# Patient Record
Sex: Male | Born: 1950 | Race: Black or African American | Hispanic: No | Marital: Single | Smoking: Never smoker
Health system: Southern US, Community
[De-identification: ages and names within clinical notes are randomized; demographics above are authoritative.]

## PROBLEM LIST (undated history)

## (undated) DIAGNOSIS — E119 Type 2 diabetes mellitus without complications: Secondary | ICD-10-CM

## (undated) DIAGNOSIS — N289 Disorder of kidney and ureter, unspecified: Secondary | ICD-10-CM

## (undated) DIAGNOSIS — I10 Essential (primary) hypertension: Secondary | ICD-10-CM

## (undated) DIAGNOSIS — K219 Gastro-esophageal reflux disease without esophagitis: Secondary | ICD-10-CM

## (undated) DIAGNOSIS — E785 Hyperlipidemia, unspecified: Secondary | ICD-10-CM

---

## 1997-09-18 ENCOUNTER — Emergency Department (HOSPITAL_COMMUNITY): Admission: EM | Admit: 1997-09-18 | Discharge: 1997-09-18 | Payer: Self-pay | Admitting: Emergency Medicine

## 2004-10-13 ENCOUNTER — Emergency Department (HOSPITAL_COMMUNITY): Admission: EM | Admit: 2004-10-13 | Discharge: 2004-10-13 | Payer: Self-pay | Admitting: *Deleted

## 2013-07-08 ENCOUNTER — Emergency Department (HOSPITAL_COMMUNITY)
Admission: EM | Admit: 2013-07-08 | Discharge: 2013-07-08 | Disposition: A | Discharge status: Home / Self Care | Payer: Self-pay | Attending: Emergency Medicine | Admitting: Emergency Medicine

## 2013-07-08 ENCOUNTER — Encounter (HOSPITAL_COMMUNITY): Payer: Self-pay | Admitting: Emergency Medicine

## 2013-07-08 DIAGNOSIS — K4090 Unilateral inguinal hernia, without obstruction or gangrene, not specified as recurrent: Secondary | ICD-10-CM | POA: Insufficient documentation

## 2013-07-08 DIAGNOSIS — N5089 Other specified disorders of the male genital organs: Secondary | ICD-10-CM | POA: Insufficient documentation

## 2013-07-08 DIAGNOSIS — K4091 Unilateral inguinal hernia, without obstruction or gangrene, recurrent: Secondary | ICD-10-CM

## 2013-07-08 DIAGNOSIS — I10 Essential (primary) hypertension: Secondary | ICD-10-CM | POA: Insufficient documentation

## 2013-07-08 DIAGNOSIS — N509 Disorder of male genital organs, unspecified: Secondary | ICD-10-CM | POA: Insufficient documentation

## 2013-07-08 DIAGNOSIS — E119 Type 2 diabetes mellitus without complications: Secondary | ICD-10-CM | POA: Insufficient documentation

## 2013-07-08 DIAGNOSIS — N289 Disorder of kidney and ureter, unspecified: Secondary | ICD-10-CM | POA: Insufficient documentation

## 2013-07-08 HISTORY — DX: Essential (primary) hypertension: I10

## 2013-07-08 LAB — BASIC METABOLIC PANEL
BUN: 44 mg/dL — AB (ref 6–23)
CALCIUM: 9.4 mg/dL (ref 8.4–10.5)
CHLORIDE: 99 meq/L (ref 96–112)
CO2: 22 mEq/L (ref 19–32)
Creatinine, Ser: 2.11 mg/dL — ABNORMAL HIGH (ref 0.50–1.35)
GFR, EST AFRICAN AMERICAN: 37 mL/min — AB (ref >90)
GFR, EST NON AFRICAN AMERICAN: 32 mL/min — AB (ref >90)
GLUCOSE: 329 mg/dL — AB (ref 70–99)
POTASSIUM: 4.5 meq/L (ref 3.7–5.3)
SODIUM: 137 meq/L (ref 137–147)

## 2013-07-08 LAB — CBC WITH DIFFERENTIAL/PLATELET
Basophils Absolute: 0 10*3/uL (ref 0.0–0.1)
Basophils Relative: 1 % (ref 0–1)
EOS PCT: 1 % (ref 0–5)
Eosinophils Absolute: 0 10*3/uL (ref 0.0–0.7)
HCT: 40.4 % (ref 39.0–52.0)
Hemoglobin: 14.3 g/dL (ref 13.0–17.0)
LYMPHS ABS: 1.1 10*3/uL (ref 0.7–4.0)
Lymphocytes Relative: 13 % (ref 12–46)
MCH: 27.4 pg (ref 26.0–34.0)
MCHC: 35.4 g/dL (ref 30.0–36.0)
MCV: 77.5 fL — ABNORMAL LOW (ref 78.0–100.0)
Monocytes Absolute: 0.4 10*3/uL (ref 0.1–1.0)
Monocytes Relative: 5 % (ref 3–12)
Neutro Abs: 6.7 10*3/uL (ref 1.7–7.7)
Neutrophils Relative %: 80 % — ABNORMAL HIGH (ref 43–77)
PLATELETS: 200 10*3/uL (ref 150–400)
RBC: 5.21 MIL/uL (ref 4.22–5.81)
RDW: 13.1 % (ref 11.5–15.5)
WBC: 8.2 10*3/uL (ref 4.0–10.5)

## 2013-07-08 LAB — I-STAT CG4 LACTIC ACID, ED: Lactic Acid, Venous: 1.04 mmol/L (ref 0.5–2.2)

## 2013-07-08 MED ORDER — HYDROMORPHONE HCL PF 1 MG/ML IJ SOLN
1.0000 mg | Freq: Once | INTRAMUSCULAR | Status: AC
  Administered 2013-07-08: 1 mg via INTRAVENOUS
  Filled 2013-07-08: qty 1

## 2013-07-08 MED ORDER — HYDROCODONE-ACETAMINOPHEN 5-325 MG PO TABS: ORAL_TABLET | ORAL | Status: DC

## 2013-07-08 MED ORDER — ONDANSETRON HCL 4 MG/2ML IJ SOLN
4.0000 mg | Freq: Once | INTRAMUSCULAR | Status: AC
  Administered 2013-07-08: 4 mg via INTRAVENOUS
  Filled 2013-07-08: qty 2

## 2013-07-08 NOTE — ED Notes (Signed)
Geiple at bedside

## 2013-07-08 NOTE — ED Provider Notes (Signed)
CSN: [id number]     Arrival date & time 07/08/13  [id number] History   First MD Initiated Contact with Patient 07/08/13 [phone]     Chief Complaint  Patient presents with  . Abdominal Pain     (Consider location/radiation/quality/duration/timing/severity/associated sxs/prior Treatment) HPI Comments: Patient with past history of diabetes and hypertension -- presents with acute onset of right groin pain at approximately 5 AM. This has been associated with swelling. Patient has had similar symptoms with this for "a long time". It seems as though he has a bulging in the area which comes and goes. Today the bulging did not get better and the pain is severe. He denies fever, nausea, vomiting, diarrhea. Patient has lived in the [place] for approximately 10 years. He is from [place]. No past history of surgeries. No past allergies. The onset of this condition was acute. The course is constant. Aggravating factors: palpation and movement. Alleviating factors: none.    Patient is a 63 y.o. male presenting with abdominal pain. The history is provided by the patient. The history is limited by a language barrier. A language interpreter was used (friend at bedside).  Abdominal Pain Associated symptoms: no chest pain, no cough, no diarrhea, no dysuria, no fever, no nausea, no sore throat and no vomiting     No past medical history on file. No past surgical history on file. No family history on file. History  Substance Use Topics  . Smoking status: Not on file  . Smokeless tobacco: Not on file  . Alcohol Use: Not on file    Review of Systems  Constitutional: Negative for fever.  HENT: Negative for rhinorrhea and sore throat.   Eyes: Negative for redness.  Respiratory: Negative for cough.   Cardiovascular: Negative for chest pain.  Gastrointestinal: Positive for abdominal pain. Negative for nausea, vomiting and diarrhea.  Genitourinary: Positive for scrotal swelling and testicular pain. Negative for  dysuria.  Musculoskeletal: Negative for myalgias.  Skin: Negative for rash.  Neurological: Negative for headaches.      Allergies  Review of patient's allergies indicates not on file.  Home Medications   Prior to Admission medications   Not on File   BP 203/100  Pulse 71  Temp(Src) 98.4 F (36.9 C) (Oral)  Resp 22  SpO2 100%  Physical Exam  Nursing note and vitals reviewed. Constitutional: He appears well-developed and well-nourished.  HENT:  Head: Normocephalic and atraumatic.  Eyes: Conjunctivae are normal. Right eye exhibits no discharge. Left eye exhibits no discharge.  Neck: Normal range of motion. Neck supple.  Cardiovascular: Normal rate, regular rhythm and normal heart sounds.   Pulmonary/Chest: Effort normal and breath sounds normal.  Abdominal: Soft. There is no tenderness. A hernia is present. Hernia confirmed positive in the right inguinal area (tennis ball sized). Hernia confirmed negative in the left inguinal area.  Genitourinary: Right testis shows mass and tenderness.  Neurological: He is alert.  Skin: Skin is warm and dry.  Psychiatric: He has a normal mood and affect.    ED Course  Procedures (including critical care time) Labs Review Labs Reviewed  CBC WITH DIFFERENTIAL - Abnormal; Notable for the following:    MCV 77.5 (*)    Neutrophils Relative % 80 (*)    All other components within normal limits  BASIC METABOLIC PANEL - Abnormal; Notable for the following:    Glucose, Bld 329 (*)    BUN 44 (*)    Creatinine, Ser 2.11 (*)    GFR calc  non Af Amer 32 (*)    GFR calc Af Amer 37 (*)    All other components within normal limits  URINALYSIS, ROUTINE W REFLEX MICROSCOPIC  I-STAT CG4 LACTIC ACID, ED    Imaging Review No results found.   EKG Interpretation None      8:58 AM Patient seen and examined. Work-up initiated. Medications ordered. Deen at bedside with Dr. Elesa MassedWard. Surgery paged. Attempts to reduce hernia in trendelenburg were  unsuccessful.   Vital signs reviewed and are as follows: Filed Vitals:   07/08/13 0840  BP: 203/100  Pulse: 71  Temp: 98.4 F (36.9 C)  Resp: 22   9:27 AM Spoke with Dr. Lindie SpruceWyatt who has seen patient. He was able to reduce hernia. Patient cleared for d/c to home and follow-up with him in office.   10:58 AM Labs reviewed with patient. Informed of elevated creatinine and need for f/u. Referrals given.  Re-exam: Pt comfortable, hernia remains reduced.  HTN improved after reduction of hernia. BP 144/87  Pulse 75  Temp(Src) 98.4 F (36.9 C) (Oral)  Resp 13  SpO2 94%   Patient counseled on use of narcotic pain medications. Counseled not to combine these medications with others containing tylenol. Urged not to drink alcohol, drive, or perform any other activities that requires focus while taking these medications. The patient verbalizes understanding and agrees with the plan.    MDM   Final diagnoses:  Inguinal hernia recurrent unilateral  Renal insufficiency  Hypertension   Hernia: reduced with the help of surgery. Outpatient f/u indicated. Pain medicine given.  Renal insufficiency: Likely chronic 2/2 HTN/DM. Outpatient f/u given.   HTN: Improved without treatment. Do not have indication to start patient on HTN meds today, outpatient f/u indicated.      Renne CriglerJoshua Kenlee Maler, PA-C 07/08/13 1101

## 2013-07-08 NOTE — ED Notes (Addendum)
Dr Lindie SpruceWyatt at bedside, reduced hernia

## 2013-07-08 NOTE — ED Notes (Signed)
Patient states that he takes medications for blood pressure and diabetes but is unsure of the name.  Patient states he cannot read or write, he only speaks Housa

## 2013-07-08 NOTE — ED Notes (Signed)
Per EMS: Patient c/o abdominal pain, from [place], speaks howza (Sp?) patient called friend this morning c/o abdominal pain. friend here with patient, patient from [place] not out of country in last 10 years.  Patient tearful, c/o swelling in groin, CBG 260, BP 194/98, spo2 100.  NSR on monitor.  States he has been fgeeling this way in the mornings for some time, has not sought care until this point

## 2013-07-08 NOTE — Discharge Instructions (Signed)
Please read and follow all provided instructions.  Your diagnoses today include:  1. Inguinal hernia recurrent unilateral   2. Renal insufficiency   3. Hypertension     Tests performed today include:  Blood counts and electrolytes  Blood tests to check kidney function - shows kidneys are weak, high blood sugar  Vital signs. See below for your results today.   Medications prescribed:   Vicodin (hydrocodone/acetaminophen) - narcotic pain medication  DO NOT drive or perform any activities that require you to be awake and alert because this medicine can make you drowsy. BE VERY CAREFUL not to take multiple medicines containing Tylenol (also called acetaminophen). Doing so can lead to an overdose which can damage your liver and cause liver failure and possibly death.  Take any prescribed medications only as directed.  Home care instructions:   Follow any educational materials contained in this packet.  Follow-up instructions: Please follow-up with your primary care provider in the next 7 days for further evaluation of your symptoms. If you do not have a primary care doctor -- see below for referral information.   Return instructions:  SEEK IMMEDIATE MEDICAL ATTENTION IF:  The pain does not go away or becomes severe   A temperature above 101F develops   Repeated vomiting occurs (multiple episodes)   The pain becomes localized to portions of the abdomen. The right side could possibly be appendicitis. In an adult, the left lower portion of the abdomen could be colitis or diverticulitis.   Blood is being passed in stools or vomit (bright red or black tarry stools)   You develop chest pain, difficulty breathing, dizziness or fainting, or become confused, poorly responsive, or inconsolable (young children)  If you have any other emergent concerns regarding your health  Additional Information: Abdominal (belly) pain can be caused by many things. Your caregiver performed an  examination and possibly ordered blood/urine tests and imaging (CT scan, x-rays, ultrasound). Many cases can be observed and treated at home after initial evaluation in the emergency department. Even though you are being discharged home, abdominal pain can be unpredictable. Therefore, you need a repeated exam if your pain does not resolve, returns, or worsens. Most patients with abdominal pain don't have to be admitted to the hospital or have surgery, but serious problems like appendicitis and gallbladder attacks can start out as nonspecific pain. Many abdominal conditions cannot be diagnosed in one visit, so follow-up evaluations are very important.  Your vital signs today were: BP 144/87   Pulse 75   Temp(Src) 98.4 F (36.9 C) (Oral)   Resp 13   SpO2 94% If your blood pressure (bp) was elevated above 135/85 this visit, please have this repeated by your doctor within one month. --------------

## 2013-07-08 NOTE — ED Notes (Signed)
Lab results reported to Dr.Ward. 

## 2013-07-08 NOTE — Consult Note (Signed)
  History of RIH, reducible.  Became incarcerated this AM at about 5.  I was able to easily reduce this hernia within a few minutes.  It will continue to come out, but not incarcerated anymore.  Needs to make appointment [facility].  Marta LamasJames O. Gae BonWyatt, III, MD, FACS [phone]--pager [phone]--office [facility]

## 2013-07-08 NOTE — ED Provider Notes (Signed)
Medical screening examination/treatment/procedure(s) were conducted as a shared visit with non-physician practitioner(s) and myself.  I personally evaluated the patient during the encounter.   EKG Interpretation None      Pt is a 63 y.o. M with history of hypertension, diabetes who presents emergency department with a incarcerated right inguinal hernia that started at 5 AM. Unable to reduce hernia despite multiple times while patient is in Trendelenburg. He is hypertensive and appears uncomfortable but is nontoxic. We'll consult surgery, obtain labs.   9:42 AM  Dr. Lindie SpruceWyatt has seen and was able to successfully reduce pt's hernia.  Will dc once labs have returned with outpt surgery follow up.  Javier MawKristen N Jaycee Pelzer, DO 07/08/13 [phone]

## 2013-07-08 NOTE — ED Notes (Signed)
Patient has a large soft mass in the right inguinal region

## 2013-07-26 ENCOUNTER — Ambulatory Visit (INDEPENDENT_AMBULATORY_CARE_PROVIDER_SITE_OTHER): Payer: Self-pay | Admitting: General Surgery

## 2013-07-26 ENCOUNTER — Encounter (INDEPENDENT_AMBULATORY_CARE_PROVIDER_SITE_OTHER): Payer: Self-pay | Admitting: General Surgery

## 2013-07-26 VITALS — BP 162/100 | HR 80 | Temp 97.2°F | Resp 14 | Ht 68.0 in | Wt 155.0 lb

## 2013-07-26 DIAGNOSIS — K4030 Unilateral inguinal hernia, with obstruction, without gangrene, not specified as recurrent: Secondary | ICD-10-CM

## 2013-07-26 NOTE — Patient Instructions (Signed)
Hernia A hernia occurs when an internal organ pushes out through a weak spot in the abdominal wall. Hernias most commonly occur in the groin and around the navel. Hernias often can be pushed back into place (reduced). Most hernias tend to get worse over time. Some abdominal hernias can get stuck in the opening (irreducible or incarcerated hernia) and cannot be reduced. An irreducible abdominal hernia which is tightly squeezed into the opening is at risk for impaired blood supply (strangulated hernia). A strangulated hernia is a medical emergency. Because of the risk for an irreducible or strangulated hernia, surgery may be recommended to repair a hernia. CAUSES   Heavy lifting.  Prolonged coughing.  Straining to have a bowel movement.  A cut (incision) made during an abdominal surgery. HOME CARE INSTRUCTIONS   Bed rest is not required. You may continue your normal activities.  Avoid lifting more than 10 pounds (4.5 kg) or straining.  Cough gently. If you are a smoker it is best to stop. Even the best hernia repair can break down with the continual strain of coughing. Even if you do not have your hernia repaired, a cough will continue to aggravate the problem.  Do not wear anything tight over your hernia. Do not try to keep it in with an outside bandage or truss. These can damage abdominal contents if they are trapped within the hernia sac.  Eat a normal diet.  Avoid constipation. Straining over long periods of time will increase hernia size and encourage breakdown of repairs. If you cannot do this with diet alone, stool softeners may be used. SEEK IMMEDIATE MEDICAL CARE IF:   You have a fever.  You develop increasing abdominal pain.  You feel nauseous or vomit.  Your hernia is stuck outside the abdomen, looks discolored, feels hard, or is tender.  You have any changes in your bowel habits or in the hernia that are unusual for you.  You have increased pain or swelling around the  hernia.  You cannot push the hernia back in place by applying gentle pressure while lying down. MAKE SURE YOU:   Understand these instructions.  Will watch your condition.  Will get help right away if you are not doing well or get worse. Document Released: 02/22/2005 Document Revised: 05/17/2011 Document Reviewed: 10/12/2007 ExitCare Patient Information 2014 [employer].  

## 2013-07-26 NOTE — Progress Notes (Signed)
Patient ID: Javier Hines, male   DOB: 1951-01-16, 63 y.o.   MRN: [id number]  Chief Complaint  Patient presents with  . New Evaluation    eval RIH    HPI Javier Hines is a 63 y.o. male.   HPI 63 year old African male initially seen in the emergency room about 2-1/2 weeks ago for a large right scrotal inguinal hernia comes in today for evaluation. His hernia was reduced in the emergency room by Dr. Lindie Spruce. History is somewhat limited due to language barrier. He states with the aid of his friend who functions as his interpreter that the hernia has been present for quite some time. It does cause him pain and discomfort.  He does have a primary care physician but he cannot remember the name of them. He does take medicine for blood pressure and diabetes. His creatinine was elevated in the emergency room. It is unclear if this is a new issue or chronic issue.  Past Medical History  Diagnosis Date  . Diabetes   . Hypertension     History reviewed. No pertinent past surgical history.  History reviewed. No pertinent family history.  Social History History  Substance Use Topics  . Smoking status: Never Smoker   . Smokeless tobacco: Never Used  . Alcohol Use: No    No Known Allergies  Current Outpatient Prescriptions  Medication Sig Dispense Refill  . atenolol (TENORMIN) 100 MG tablet Take 100 mg by mouth daily.      Marland Kitchen glipiZIDE (GLUCOTROL) 10 MG tablet Take 10 mg by mouth daily before breakfast.      . metFORMIN (GLUCOPHAGE) 500 MG tablet Take by mouth 2 (two) times daily with a meal.      . omeprazole (PRILOSEC) 20 MG capsule Take 20 mg by mouth daily.       No current facility-administered medications for this visit.    Review of Systems Review of Systems  Constitutional: Negative for fever, chills, appetite change and unexpected weight change.  HENT: Negative for congestion and trouble swallowing.   Eyes: Negative for visual disturbance.  Respiratory: Positive for shortness of  breath. Negative for chest tightness.   Cardiovascular: Negative for chest pain and leg swelling.       No PND, no orthopnea, no DOE  Gastrointestinal: Positive for abdominal pain. Negative for nausea, constipation and blood in stool.       See HPI  Genitourinary: Negative for dysuria and hematuria.  Musculoskeletal: Negative.   Skin: Negative for rash.  Neurological: Negative for seizures and speech difficulty.  Hematological: Does not bruise/bleed easily.  Psychiatric/Behavioral: Negative for behavioral problems and confusion.    Blood pressure 162/100, pulse 80, temperature 97.2 F (36.2 C), temperature source Temporal, resp. rate 14, height 5\' 8"  (1.727 m), weight 155 lb (70.308 kg).  Physical Exam Physical Exam  Constitutional: He is oriented to person, place, and time. He appears well-developed and well-nourished. No distress.  HENT:  Head: Normocephalic and atraumatic.  Right Ear: External ear normal.  Left Ear: External ear normal.  Eyes: Conjunctivae are normal. No scleral icterus.  Neck: Normal range of motion. Neck supple. No tracheal deviation present. No thyromegaly present.  Cardiovascular: Normal rate, normal heart sounds and intact distal pulses.   Pulmonary/Chest: Effort normal and breath sounds normal. No respiratory distress. He has no wheezes.  Abdominal: Soft. He exhibits no distension. There is no tenderness. There is no rebound. A hernia is present. Hernia confirmed positive in the right inguinal area. Hernia confirmed negative  in the left inguinal area.  Genitourinary: Testes normal.     Decent size R inguinal/scrotal incarcerated hernia, reducible. Both testicles down.  Musculoskeletal: Normal range of motion. He exhibits no edema and no tenderness.  Lymphadenopathy:    He has no cervical adenopathy.  Neurological: He is alert and oriented to person, place, and time. He exhibits normal muscle tone.  Skin: Skin is warm and dry. No rash noted. He is not  diaphoretic. No erythema. No pallor.  Psychiatric: He has a normal mood and affect. His behavior is normal. Judgment and thought content normal.    Data Reviewed ED note and Dr Dixon BoosWyatt's note Cr >2  Assessment    Large chronic incarcerated inguinal hernia     Plan    We discussed the etiology of inguinal hernias. We discussed the signs & symptoms of incarceration & strangulation.  We discussed non-operative and operative management.  The patient has elected to proceed with open repair of Right incarcerated Inguinal hernia with mesh    I described the procedure in detail.  The patient was given educational material. We discussed the risks and benefits including but not limited to bleeding, infection, chronic inguinal pain, nerve entrapment, hernia recurrence, mesh complications, hematoma formation, urinary retention, injury to the testicles or the ovaries, numbness in the groin, blood clots, injury to the surrounding structures, and anesthesia risk. We also discussed the typical post operative recovery course, including no heavy lifting for 4-6 weeks. I explained that the likelihood of improvement of their symptoms is good   The conversation was done with aid of language line - language is Hausa  Mary Sellaric M. Andrey CampanileWilson, MD, Javier Hines [facility] B[facility]ally [facility]        Atilano Inaric M Derisha Funderburke 07/26/2013, 10:38 AM

## 2013-07-27 ENCOUNTER — Encounter (HOSPITAL_COMMUNITY): Payer: Self-pay | Admitting: Pharmacy Technician

## 2013-07-31 NOTE — Patient Instructions (Signed)
Javier Hines  07/31/2013   Your procedure is scheduled on:  08/03/13   0930am-1130am  Report to [facility].  Follow the Signs to Short Stay Center at  0730      am  Call this number if you have problems the morning of surgery: [phone]   Remember:   Do not eat food or drink liquids after midnight.   Take these medicines the morning of surgery with A SIP OF WATER:    Do not wear jewelry,   Do not wear lotions, powders, or perfumes.    Men may shave face and neck.  Do not bring valuables to the hospital.  Contacts, dentures or bridgework may not be worn into surgery.  Leave suitcase in the car. After surgery it may be brought to your room.  For patients admitted to the hospital, checkout time is 11:00 AM the day of  discharge.          Please read over the following fact sheets that you were [facility] - Preparing for Surgery Before surgery, you can play an important role.  Because skin is not sterile, your skin needs to be as free of germs as possible.  You can reduce the number of germs on your skin by washing with CHG (chlorahexidine gluconate) soap before surgery.  CHG is an antiseptic cleaner which kills germs and bonds with the skin to continue killing germs even after washing. Please DO NOT use if you have an allergy to CHG or antibacterial soaps.  If your skin becomes reddened/irritated stop using the CHG and inform your nurse when you arrive at Short Stay. Do not shave (including legs and underarms) for at least 48 hours prior to the first CHG shower.  You may shave your face/neck. Please follow these instructions carefully:  1.  Shower with CHG Soap the night before surgery and the  morning of Surgery.  2.  If you choose to wash your hair, wash your hair first as usual with your  normal  shampoo.  3.  After you shampoo, rinse your hair and body thoroughly to remove the  shampoo.                           4.  Use CHG as you would any other liquid soap.  You can  apply chg directly  to the skin and wash                       Gently with a scrungie or clean washcloth.  5.  Apply the CHG Soap to your body ONLY FROM THE NECK DOWN.   Do not use on face/ open                           Wound or open sores. Avoid contact with eyes, ears mouth and genitals (private parts).                       Wash face,  Genitals (private parts) with your normal soap.             6.  Wash thoroughly, paying special attention to the area where your surgery  will be performed.  7.  Thoroughly rinse your body with warm water from the neck down.  8.  DO NOT shower/wash with your normal soap after using and rinsing off  the CHG Soap.                9.  Pat yourself dry with a clean towel.            10.  Wear clean pajamas.            11.  Place clean sheets on your bed the night of your first shower and do not  sleep with pets. Day of Surgery : Do not apply any lotions/deodorants the morning of surgery.  Please wear clean clothes to the hospital/surgery center.  FAILURE TO FOLLOW THESE INSTRUCTIONS MAY RESULT IN THE CANCELLATION OF YOUR SURGERY PATIENT SIGNATURE_________________________________  NURSE SIGNATURE__________________________________  ________________________________________________________________________ , coughing and deep breathing exercises, leg exercises

## 2013-08-02 ENCOUNTER — Encounter (HOSPITAL_COMMUNITY): Payer: Self-pay

## 2013-08-02 ENCOUNTER — Ambulatory Visit (HOSPITAL_COMMUNITY)
Admission: RE | Admit: 2013-08-02 | Discharge: 2013-08-02 | Disposition: A | Discharge status: Home / Self Care | Payer: Self-pay | Source: Ambulatory Visit | Attending: General Surgery | Admitting: General Surgery

## 2013-08-02 ENCOUNTER — Encounter (HOSPITAL_COMMUNITY)
Admission: RE | Admit: 2013-08-02 | Discharge: 2013-08-02 | Disposition: A | Discharge status: Home / Self Care | Payer: Self-pay | Source: Ambulatory Visit | Attending: General Surgery | Admitting: General Surgery

## 2013-08-02 DIAGNOSIS — I10 Essential (primary) hypertension: Secondary | ICD-10-CM | POA: Insufficient documentation

## 2013-08-02 HISTORY — DX: Disorder of kidney and ureter, unspecified: N28.9

## 2013-08-02 LAB — CBC
HEMATOCRIT: 41.4 % (ref 39.0–52.0)
HEMOGLOBIN: 14.4 g/dL (ref 13.0–17.0)
MCH: 26.9 pg (ref 26.0–34.0)
MCHC: 34.8 g/dL (ref 30.0–36.0)
MCV: 77.4 fL — AB (ref 78.0–100.0)
Platelets: 192 10*3/uL (ref 150–400)
RBC: 5.35 MIL/uL (ref 4.22–5.81)
RDW: 12.7 % (ref 11.5–15.5)
WBC: 6.4 10*3/uL (ref 4.0–10.5)

## 2013-08-02 LAB — COMPREHENSIVE METABOLIC PANEL
ALT: 15 U/L (ref 0–53)
AST: 13 U/L (ref 0–37)
Albumin: 3.8 g/dL (ref 3.5–5.2)
Alkaline Phosphatase: 104 U/L (ref 39–117)
BUN: 35 mg/dL — AB (ref 6–23)
CO2: 28 meq/L (ref 19–32)
Calcium: 9.5 mg/dL (ref 8.4–10.5)
Chloride: 94 mEq/L — ABNORMAL LOW (ref 96–112)
Creatinine, Ser: 2.27 mg/dL — ABNORMAL HIGH (ref 0.50–1.35)
GFR, EST AFRICAN AMERICAN: 34 mL/min — AB (ref >90)
GFR, EST NON AFRICAN AMERICAN: 29 mL/min — AB (ref >90)
Glucose, Bld: 336 mg/dL — ABNORMAL HIGH (ref 70–99)
Potassium: 4.3 mEq/L (ref 3.7–5.3)
SODIUM: 136 meq/L — AB (ref 137–147)
Total Bilirubin: 0.5 mg/dL (ref 0.3–1.2)
Total Protein: 7.1 g/dL (ref 6.0–8.3)

## 2013-08-02 NOTE — Progress Notes (Signed)
CMP results faxed via EPIC to Dr Gaynelle Adu.

## 2013-08-02 NOTE — Progress Notes (Signed)
Initial blood pressure at time of preop appointment was 198/110 on right and 178/109 on left. At 0915 am.  Recheck of blood pressure was 180/102on right and left was 156/101.  At end of preop appointment blood pressure was 178/101 on left at 1015am.  Interpreter present for preop appointment .  Patient did not take medications this am prior to preop appointment.  Instructed patient at end of preop appointment to go home and eat and drink and take blood pressure medication.  Patient voiced understanding.

## 2013-08-02 NOTE — Progress Notes (Signed)
Dr Mikeal Hawthorne with [facility] is PCP for patient.  Phone [phone] [phone].

## 2013-08-02 NOTE — Progress Notes (Signed)
Spoke with Annite, nurse of Dr Gaynelle Adu regarding elevated blood pressure readings at today's preop appointment.  Informed her that progress note had been sent via EPIC to Dr Andrey Campanile.  She stated she would inform Dr Andrey Campanile.

## 2013-08-03 ENCOUNTER — Ambulatory Visit (HOSPITAL_COMMUNITY): Payer: Self-pay | Admitting: *Deleted

## 2013-08-03 ENCOUNTER — Encounter (HOSPITAL_COMMUNITY): Payer: Self-pay | Admitting: *Deleted

## 2013-08-03 ENCOUNTER — Encounter (HOSPITAL_COMMUNITY): Payer: MEDICAID | Admitting: *Deleted

## 2013-08-03 ENCOUNTER — Ambulatory Visit (HOSPITAL_COMMUNITY)
Admission: RE | Admit: 2013-08-03 | Discharge: 2013-08-03 | Disposition: A | Discharge status: Home / Self Care | Payer: Self-pay | Source: Ambulatory Visit | Attending: General Surgery | Admitting: General Surgery

## 2013-08-03 ENCOUNTER — Encounter (HOSPITAL_COMMUNITY)
Admission: RE | Disposition: A | Discharge status: Home / Self Care | Payer: Self-pay | Source: Ambulatory Visit | Attending: General Surgery

## 2013-08-03 DIAGNOSIS — I10 Essential (primary) hypertension: Secondary | ICD-10-CM | POA: Insufficient documentation

## 2013-08-03 DIAGNOSIS — Z79899 Other long term (current) drug therapy: Secondary | ICD-10-CM | POA: Insufficient documentation

## 2013-08-03 DIAGNOSIS — E119 Type 2 diabetes mellitus without complications: Secondary | ICD-10-CM | POA: Insufficient documentation

## 2013-08-03 DIAGNOSIS — K219 Gastro-esophageal reflux disease without esophagitis: Secondary | ICD-10-CM | POA: Insufficient documentation

## 2013-08-03 DIAGNOSIS — K4030 Unilateral inguinal hernia, with obstruction, without gangrene, not specified as recurrent: Secondary | ICD-10-CM

## 2013-08-03 HISTORY — PX: INSERTION OF MESH: SHX5868

## 2013-08-03 HISTORY — PX: INGUINAL HERNIA REPAIR: SHX194

## 2013-08-03 LAB — GLUCOSE, CAPILLARY
GLUCOSE-CAPILLARY: 120 mg/dL — AB (ref 70–99)
Glucose-Capillary: 330 mg/dL — ABNORMAL HIGH (ref 70–99)
Glucose-Capillary: 214 mg/dL — ABNORMAL HIGH (ref 70–99)
Glucose-Capillary: 72 mg/dL (ref 70–99)

## 2013-08-03 SURGERY — REPAIR, HERNIA, INGUINAL, ADULT
Anesthesia: General | Site: Groin | Laterality: Right

## 2013-08-03 MED ORDER — ROCURONIUM BROMIDE 100 MG/10ML IV SOLN
INTRAVENOUS | Status: AC
  Filled 2013-08-03: qty 1

## 2013-08-03 MED ORDER — FENTANYL CITRATE 0.05 MG/ML IJ SOLN
INTRAMUSCULAR | Status: AC
  Filled 2013-08-03: qty 5

## 2013-08-03 MED ORDER — 0.9 % SODIUM CHLORIDE (POUR BTL) OPTIME
TOPICAL | Status: DC | PRN
  Administered 2013-08-03: 1000 mL

## 2013-08-03 MED ORDER — NEOSTIGMINE METHYLSULFATE 10 MG/10ML IV SOLN
INTRAVENOUS | Status: DC | PRN
  Administered 2013-08-03: 4 mg via INTRAVENOUS

## 2013-08-03 MED ORDER — HYDROMORPHONE HCL PF 1 MG/ML IJ SOLN
INTRAMUSCULAR | Status: AC
  Filled 2013-08-03: qty 1

## 2013-08-03 MED ORDER — DEXTROSE 50 % IV SOLN
INTRAVENOUS | Status: DC | PRN
  Administered 2013-08-03: 25 mL via INTRAVENOUS

## 2013-08-03 MED ORDER — PROPOFOL 10 MG/ML IV BOLUS
INTRAVENOUS | Status: AC
  Filled 2013-08-03: qty 20

## 2013-08-03 MED ORDER — GLYCOPYRROLATE 0.2 MG/ML IJ SOLN
INTRAMUSCULAR | Status: DC | PRN
  Administered 2013-08-03: 0.6 mg via INTRAVENOUS

## 2013-08-03 MED ORDER — ESMOLOL HCL 10 MG/ML IV SOLN
INTRAVENOUS | Status: AC
  Filled 2013-08-03: qty 10

## 2013-08-03 MED ORDER — PROMETHAZINE HCL 25 MG/ML IJ SOLN: 6.2500 mg | INTRAMUSCULAR | Status: DC | PRN

## 2013-08-03 MED ORDER — ONDANSETRON HCL 4 MG/2ML IJ SOLN
INTRAMUSCULAR | Status: DC | PRN
  Administered 2013-08-03: 4 mg via INTRAVENOUS

## 2013-08-03 MED ORDER — OXYCODONE HCL 5 MG PO TABS: 5.0000 mg | ORAL_TABLET | Freq: Four times a day (QID) | ORAL | Status: AC | PRN

## 2013-08-03 MED ORDER — LIDOCAINE HCL (CARDIAC) 20 MG/ML IV SOLN
INTRAVENOUS | Status: DC | PRN
  Administered 2013-08-03: 50 mg via INTRAVENOUS

## 2013-08-03 MED ORDER — FENTANYL CITRATE 0.05 MG/ML IJ SOLN
INTRAMUSCULAR | Status: DC | PRN
  Administered 2013-08-03: 50 ug via INTRAVENOUS
  Administered 2013-08-03: 100 ug via INTRAVENOUS
  Administered 2013-08-03 (×2): 50 ug via INTRAVENOUS

## 2013-08-03 MED ORDER — BUPIVACAINE-EPINEPHRINE 0.25% -1:200000 IJ SOLN
INTRAMUSCULAR | Status: DC | PRN
  Administered 2013-08-03: 30 mL

## 2013-08-03 MED ORDER — SODIUM CHLORIDE 0.9 % IV SOLN: 250.0000 mL | INTRAVENOUS | Status: DC | PRN

## 2013-08-03 MED ORDER — PHENYLEPHRINE 40 MCG/ML (10ML) SYRINGE FOR IV PUSH (FOR BLOOD PRESSURE SUPPORT)
PREFILLED_SYRINGE | INTRAVENOUS | Status: AC
  Filled 2013-08-03: qty 10

## 2013-08-03 MED ORDER — ESMOLOL HCL 10 MG/ML IV SOLN
INTRAVENOUS | Status: DC | PRN
  Administered 2013-08-03: 30 mg via INTRAVENOUS

## 2013-08-03 MED ORDER — CEFAZOLIN SODIUM-DEXTROSE 2-3 GM-% IV SOLR
INTRAVENOUS | Status: AC
  Filled 2013-08-03: qty 50

## 2013-08-03 MED ORDER — POTASSIUM CHLORIDE IN NACL 20-0.9 MEQ/L-% IV SOLN: INTRAVENOUS | Status: DC

## 2013-08-03 MED ORDER — BUPIVACAINE-EPINEPHRINE (PF) 0.25% -1:200000 IJ SOLN
INTRAMUSCULAR | Status: AC
  Filled 2013-08-03: qty 30

## 2013-08-03 MED ORDER — MORPHINE SULFATE 10 MG/ML IJ SOLN
1.0000 mg | INTRAMUSCULAR | Status: DC | PRN
Start: 2013-08-03 — End: 2013-08-03

## 2013-08-03 MED ORDER — LACTATED RINGERS IV SOLN
INTRAVENOUS | Status: DC | PRN
  Administered 2013-08-03: 09:00:00 via INTRAVENOUS

## 2013-08-03 MED ORDER — ACETAMINOPHEN 325 MG PO TABS: 650.0000 mg | ORAL_TABLET | ORAL | Status: DC | PRN

## 2013-08-03 MED ORDER — ACETAMINOPHEN 650 MG RE SUPP
650.0000 mg | RECTAL | Status: DC | PRN
  Filled 2013-08-03: qty 1

## 2013-08-03 MED ORDER — INSULIN ASPART 100 UNIT/ML ~~LOC~~ SOLN
0.0000 [IU] | SUBCUTANEOUS | Status: DC
  Administered 2013-08-03: 11 [IU] via SUBCUTANEOUS
  Filled 2013-08-03: qty 1

## 2013-08-03 MED ORDER — ROCURONIUM BROMIDE 100 MG/10ML IV SOLN
INTRAVENOUS | Status: DC | PRN
  Administered 2013-08-03: 50 mg via INTRAVENOUS

## 2013-08-03 MED ORDER — LACTATED RINGERS IV SOLN: INTRAVENOUS | Status: DC

## 2013-08-03 MED ORDER — CHLORHEXIDINE GLUCONATE 4 % EX LIQD: 1.0000 "application " | Freq: Once | CUTANEOUS | Status: DC

## 2013-08-03 MED ORDER — MIDAZOLAM HCL 5 MG/5ML IJ SOLN
INTRAMUSCULAR | Status: DC | PRN
  Administered 2013-08-03: 2 mg via INTRAVENOUS

## 2013-08-03 MED ORDER — PROPOFOL 10 MG/ML IV BOLUS
INTRAVENOUS | Status: DC | PRN
  Administered 2013-08-03: 180 mg via INTRAVENOUS

## 2013-08-03 MED ORDER — PHENYLEPHRINE HCL 10 MG/ML IJ SOLN
INTRAMUSCULAR | Status: DC | PRN
  Administered 2013-08-03: 60 ug via INTRAVENOUS
  Administered 2013-08-03: 80 ug via INTRAVENOUS
  Administered 2013-08-03 (×3): 40 ug via INTRAVENOUS
  Administered 2013-08-03: 60 ug via INTRAVENOUS

## 2013-08-03 MED ORDER — MIDAZOLAM HCL 2 MG/2ML IJ SOLN
INTRAMUSCULAR | Status: AC
  Filled 2013-08-03: qty 2

## 2013-08-03 MED ORDER — HYDROMORPHONE HCL PF 1 MG/ML IJ SOLN
0.2500 mg | INTRAMUSCULAR | Status: DC | PRN
  Administered 2013-08-03: 0.5 mg via INTRAVENOUS

## 2013-08-03 MED ORDER — SODIUM CHLORIDE 0.9 % IJ SOLN: 3.0000 mL | INTRAMUSCULAR | Status: DC | PRN

## 2013-08-03 MED ORDER — CEFAZOLIN SODIUM-DEXTROSE 2-3 GM-% IV SOLR
2.0000 g | INTRAVENOUS | Status: AC
  Administered 2013-08-03: 2 g via INTRAVENOUS

## 2013-08-03 MED ORDER — SODIUM CHLORIDE 0.9 % IJ SOLN: 3.0000 mL | Freq: Two times a day (BID) | INTRAMUSCULAR | Status: DC

## 2013-08-03 MED ORDER — DEXTROSE 50 % IV SOLN
INTRAVENOUS | Status: AC
  Filled 2013-08-03: qty 50

## 2013-08-03 MED ORDER — ONDANSETRON HCL 4 MG/2ML IJ SOLN
INTRAMUSCULAR | Status: AC
  Filled 2013-08-03: qty 2

## 2013-08-03 MED ORDER — OXYCODONE HCL 5 MG PO TABS: 5.0000 mg | ORAL_TABLET | ORAL | Status: DC | PRN

## 2013-08-03 MED ORDER — LIDOCAINE HCL (CARDIAC) 20 MG/ML IV SOLN
INTRAVENOUS | Status: AC
  Filled 2013-08-03: qty 5

## 2013-08-03 SURGICAL SUPPLY — 33 items
ADH SKN CLS APL DERMABOND .7 (GAUZE/BANDAGES/DRESSINGS) ×1
APL SKNCLS STERI-STRIP NONHPOA (GAUZE/BANDAGES/DRESSINGS)
BENZOIN TINCTURE PRP APPL 2/3 (GAUZE/BANDAGES/DRESSINGS) ×1 IMPLANT
BLADE SURG SZ10 CARB STEEL (BLADE) ×2 IMPLANT
CLOSURE WOUND 1/2 X4 (GAUZE/BANDAGES/DRESSINGS)
DECANTER SPIKE VIAL GLASS SM (MISCELLANEOUS) IMPLANT
DERMABOND ADVANCED (GAUZE/BANDAGES/DRESSINGS) ×2
DERMABOND ADVANCED .7 DNX12 (GAUZE/BANDAGES/DRESSINGS) IMPLANT
DRAIN PENROSE 18X1/2 LTX STRL (DRAIN) ×2 IMPLANT
DRAPE LAPAROTOMY TRNSV 102X78 (DRAPE) ×3 IMPLANT
DRAPE UTILITY XL STRL (DRAPES) ×3 IMPLANT
DRSG TEGADERM 4X4.75 (GAUZE/BANDAGES/DRESSINGS) ×1 IMPLANT
ELECT REM PT RETURN 9FT ADLT (ELECTROSURGICAL) ×3
ELECTRODE REM PT RTRN 9FT ADLT (ELECTROSURGICAL) ×1 IMPLANT
GLOVE BIO SURGEON STRL SZ7.5 (GLOVE) ×3 IMPLANT
GLOVE BIOGEL M STRL SZ7.5 (GLOVE) IMPLANT
GLOVE BIOGEL PI IND STRL 6.5 (GLOVE) IMPLANT
GLOVE BIOGEL PI INDICATOR 6.5 (GLOVE) ×2
GLOVE INDICATOR 8.0 STRL GRN (GLOVE) ×3 IMPLANT
GOWN STRL REUS W/TWL LRG LVL3 (GOWN DISPOSABLE) ×1 IMPLANT
GOWN STRL REUS W/TWL XL LVL3 (GOWN DISPOSABLE) ×6 IMPLANT
KIT BASIN OR (CUSTOM PROCEDURE TRAY) ×3 IMPLANT
MESH ULTRAPRO 3X6 7.6X15CM (Mesh General) ×2 IMPLANT
NEEDLE HYPO 22GX1.5 SAFETY (NEEDLE) ×3 IMPLANT
PACK GENERAL/GYN (CUSTOM PROCEDURE TRAY) ×3 IMPLANT
STRIP CLOSURE SKIN 1/2X4 (GAUZE/BANDAGES/DRESSINGS) ×1 IMPLANT
SUT MNCRL AB 4-0 PS2 18 (SUTURE) ×3 IMPLANT
SUT PROLENE 2 0 CT2 30 (SUTURE) ×8 IMPLANT
SUT VIC AB 2-0 SH 27 (SUTURE) ×6
SUT VIC AB 2-0 SH 27X BRD (SUTURE) ×1 IMPLANT
SUT VIC AB 3-0 SH 18 (SUTURE) ×3 IMPLANT
SYR CONTROL 10ML LL (SYRINGE) ×3 IMPLANT
TOWEL OR 17X26 10 PK STRL BLUE (TOWEL DISPOSABLE) ×3 IMPLANT

## 2013-08-03 NOTE — Transfer of Care (Signed)
Immediate Anesthesia Transfer of Care Note  Patient: Javier Hines  Procedure(s) Performed: Procedure(s): OPEN REPAIR RIGHT INCARCERATED INGUINAL HERNIA (Right) INSERTION OF MESH (Right)  Patient Location: PACU  Anesthesia Type:General  Level of Consciousness: awake, oriented and patient cooperative  Airway & Oxygen Therapy: Patient Spontanous Breathing and Patient connected to face mask oxygen  Post-op Assessment: Report given to PACU RN, Post -op Vital signs reviewed and stable and Patient moving all extremities  Post vital signs: Reviewed and stable  Complications: No apparent anesthesia complications

## 2013-08-03 NOTE — Progress Notes (Signed)
Informed Dr. Rica Mast of pt's elevated CBG 330 and elevated blood pressure.  Reviewed pre-op labs and BP with Dr. Rica Mast. New orders given, see MAR.

## 2013-08-03 NOTE — Discharge Instructions (Signed)
See discharge instructions translated by interpreter  [facility]  UMBILICAL OR INGUINAL HERNIA REPAIR: POST OP INSTRUCTIONS  Always review your discharge instruction sheet given to you by the facility where your surgery was performed. IF YOU HAVE DISABILITY OR FAMILY LEAVE FORMS, YOU MUST BRING THEM TO THE OFFICE FOR PROCESSING.   DO NOT GIVE THEM TO YOUR DOCTOR.  1. A  prescription for pain medication may be given to you upon discharge.  Take your pain medication as prescribed, if needed.  If narcotic pain medicine is not needed, then you may take acetaminophen (Tylenol) or ibuprofen (Advil) as needed. 2. Take your usually prescribed medications unless otherwise directed. 3. If you need a refill on your pain medication, please contact your pharmacy.  They will contact our office to request authorization. Prescriptions will not be filled after 5 pm or on week-ends. 4. You should follow a light diet the first 24 hours after arrival home, such as soup and crackers, etc.  Be sure to include lots of fluids daily.  Resume your normal diet the day after surgery. 5. Most patients will experience some swelling and bruising around the umbilicus or in the groin and scrotum.  Ice packs and reclining will help.  Swelling and bruising can take several days to resolve.  6. It is common to experience some constipation if taking pain medication after surgery.  Increasing fluid intake and taking a stool softener (such as Colace) will usually help or prevent this problem from occurring.  A mild laxative (Milk of Magnesia or Miralax) should be taken according to package directions if there are no bowel movements after 48 hours. 7.   If your surgeon used skin glue on the incision, you may shower in 24 hours.  The glue will flake off over the next 2-3 weeks.  Any sutures or staples will be removed at the office during your follow-up visit. 8. ACTIVITIES:  You may resume regular (light) daily  activities beginning the next day--such as daily self-care, walking, climbing stairs--gradually increasing activities as tolerated.  You may have sexual intercourse when it is comfortable.  Refrain from any heavy lifting or straining until approved by your doctor. a. You may drive when you are no longer taking prescription pain medication, you can comfortably wear a seatbelt, and you can safely maneuver your car and apply brakes. b. RETURN TO WORK:  9. You should see your doctor in the office for a follow-up appointment approximately 2-3 weeks after your surgery.  Make sure that you call for this appointment within a day or two after you arrive home to insure a convenient appointment time. 10. OTHER INSTRUCTIONS: DO NOT LIFT, PUSH, OR  PULL ANYTHING GREATER THAN 10 POUNDS FOR 6 WEEKS; TAKE YOUR BLOOD PRESSURE AND DIABETES MEDICATIONS   WHEN TO CALL YOUR DOCTOR: 1. Fever over 101.0 2. Inability to urinate 3. Nausea and/or vomiting 4. Extreme swelling or bruising 5. Continued bleeding from incision. 6. Increased pain, redness, or drainage from the incision  The clinic staff is available to answer your questions during regular business hours.  Please dont hesitate to call and ask to speak to one of the nurses for clinical concerns.  If you have a medical emergency, go to the nearest emergency room or call 911.  A surgeon from [facility] is always on call at the hospital   [address], [address], [place], [place]  [address] ?  [address] [address], [place], [place]  [id number] [phone] ? [phone] ? FAX [phone] Web site: [email or web address]

## 2013-08-03 NOTE — Anesthesia Postprocedure Evaluation (Signed)
Anesthesia Post Note  Patient: Javier Hines  Procedure(s) Performed: Procedure(s) (LRB): OPEN REPAIR RIGHT INCARCERATED INGUINAL HERNIA (Right) INSERTION OF MESH (Right)  Anesthesia type: General  Patient location: PACU  Post pain: Pain level controlled  Post assessment: Post-op Vital signs reviewed  Last Vitals:  Filed Vitals:   08/03/13 1418  BP: 174/91  Pulse: 71  Temp:   Resp: 18    Post vital signs: Reviewed  Level of consciousness: sedated  Complications: No apparent anesthesia complications

## 2013-08-03 NOTE — Progress Notes (Signed)
Pt up and ambulated in hall to bathroom.  Pt voided moderate amount of clear yellow urine.  Pt tolerated well and is without complaints.

## 2013-08-03 NOTE — H&P (View-Only) (Signed)
Patient ID: Javier Hines, male   DOB: 1951-01-16, 63 y.o.   MRN: [id number]  Chief Complaint  Patient presents with  . New Evaluation    eval RIH    HPI Javier Hines is a 63 y.o. male.   HPI 63 year old African male initially seen in the emergency room about 2-1/2 weeks ago for a large right scrotal inguinal hernia comes in today for evaluation. His hernia was reduced in the emergency room by Javier. Lindie Hines. History is somewhat limited due to language barrier. He states with the aid of his friend who functions as his interpreter that the hernia has been present for quite some time. It does cause him pain and discomfort.  He does have a primary care physician but he cannot remember the name of them. He does take medicine for blood pressure and diabetes. His creatinine was elevated in the emergency room. It is unclear if this is a new issue or chronic issue.  Past Medical History  Diagnosis Date  . Diabetes   . Hypertension     History reviewed. No pertinent past surgical history.  History reviewed. No pertinent family history.  Social History History  Substance Use Topics  . Smoking status: Never Smoker   . Smokeless tobacco: Never Used  . Alcohol Use: No    No Known Allergies  Current Outpatient Prescriptions  Medication Sig Dispense Refill  . atenolol (TENORMIN) 100 MG tablet Take 100 mg by mouth daily.      Marland Kitchen glipiZIDE (GLUCOTROL) 10 MG tablet Take 10 mg by mouth daily before breakfast.      . metFORMIN (GLUCOPHAGE) 500 MG tablet Take by mouth 2 (two) times daily with a meal.      . omeprazole (PRILOSEC) 20 MG capsule Take 20 mg by mouth daily.       No current facility-administered medications for this visit.    Review of Systems Review of Systems  Constitutional: Negative for fever, chills, appetite change and unexpected weight change.  HENT: Negative for congestion and trouble swallowing.   Eyes: Negative for visual disturbance.  Respiratory: Positive for shortness of  breath. Negative for chest tightness.   Cardiovascular: Negative for chest pain and leg swelling.       No PND, no orthopnea, no DOE  Gastrointestinal: Positive for abdominal pain. Negative for nausea, constipation and blood in stool.       See HPI  Genitourinary: Negative for dysuria and hematuria.  Musculoskeletal: Negative.   Skin: Negative for rash.  Neurological: Negative for seizures and speech difficulty.  Hematological: Does not bruise/bleed easily.  Psychiatric/Behavioral: Negative for behavioral problems and confusion.    Blood pressure 162/100, pulse 80, temperature 97.2 F (36.2 C), temperature source Temporal, resp. rate 14, height 5\' 8"  (1.727 m), weight 155 lb (70.308 kg).  Physical Exam Physical Exam  Constitutional: He is oriented to person, place, and time. He appears well-developed and well-nourished. No distress.  HENT:  Head: Normocephalic and atraumatic.  Right Ear: External ear normal.  Left Ear: External ear normal.  Eyes: Conjunctivae are normal. No scleral icterus.  Neck: Normal range of motion. Neck supple. No tracheal deviation present. No thyromegaly present.  Cardiovascular: Normal rate, normal heart sounds and intact distal pulses.   Pulmonary/Chest: Effort normal and breath sounds normal. No respiratory distress. He has no wheezes.  Abdominal: Soft. He exhibits no distension. There is no tenderness. There is no rebound. A hernia is present. Hernia confirmed positive in the right inguinal area. Hernia confirmed negative  in the left inguinal area.  Genitourinary: Testes normal.     Decent size R inguinal/scrotal incarcerated hernia, reducible. Both testicles down.  Musculoskeletal: Normal range of motion. He exhibits no edema and no tenderness.  Lymphadenopathy:    He has no cervical adenopathy.  Neurological: He is alert and oriented to person, place, and time. He exhibits normal muscle tone.  Skin: Skin is warm and dry. No rash noted. He is not  diaphoretic. No erythema. No pallor.  Psychiatric: He has a normal mood and affect. His behavior is normal. Judgment and thought content normal.    Data Reviewed ED note and Javier Hines's note Cr >2  Assessment    Large chronic incarcerated inguinal hernia     Plan    We discussed the etiology of inguinal hernias. We discussed the signs & symptoms of incarceration & strangulation.  We discussed non-operative and operative management.  The patient has elected to proceed with open repair of Right incarcerated Inguinal hernia with mesh    I described the procedure in detail.  The patient was given educational material. We discussed the risks and benefits including but not limited to bleeding, infection, chronic inguinal pain, nerve entrapment, hernia recurrence, mesh complications, hematoma formation, urinary retention, injury to the testicles or the ovaries, numbness in the groin, blood clots, injury to the surrounding structures, and anesthesia risk. We also discussed the typical post operative recovery course, including no heavy lifting for 4-6 weeks. I explained that the likelihood of improvement of their symptoms is good   The conversation was done with aid of language line - language is Hausa  Javier Sellaric M. Andrey CampanileWilson, MD, FACS General, Bariatric, & Minimally Invasive Surgery [facility]        Javier Hines 07/26/2013, 10:38 AM

## 2013-08-03 NOTE — Op Note (Signed)
Zakory Zucconi [id number] 1950-06-06 08/03/2013   Open Right Indirect Incarcerated Inguinal Hernia Repair with Mesh Procedure Note  Indications: The patient presented with a history of a right, not reducible hernia.    Pre-operative Diagnosis: right (not reducible) incarcerated inguinal hernia   Post-operative Diagnosis: right indirect incarcerated inguinal hernia  Surgeon: Atilano Ina   Assistants: none  Anesthesia: General endotracheal anesthesia  ASA Class: 3   Surgeon: Mary Sella. Andrey Campanile, MD, FACS  Procedure Details  The patient was seen again in the Holding Room. The risks, benefits, complications, treatment options, and expected outcomes were discussed with the patient. The possibilities of reaction to medication, pulmonary aspiration, perforation of viscus, bleeding, recurrent infection, the need for additional procedures, and development of a complication requiring transfusion or further operation were discussed with the patient and/or family. The likelihood of success in repairing the hernia and returning the patient to their previous functional status is good.  There was concurrence with the proposed plan, and informed consent was obtained. The site of surgery was properly noted/marked. The patient was taken to the Operating Room, identified as Franchot Erichsen, and the procedure verified as right inguinal hernia repair. A Time Out was held and the above information confirmed.  The patient was placed in the supine position and underwent induction of anesthesia. The lower abdomen and groin was prepped with Chloraprep and draped in the standard fashion, and 0.25% Marcaine with epinephrine was used to anesthetize the skin over the mid-portion of the inguinal canal. An oblique incision was made. Dissection was carried down through the subcutaneous tissue with cautery to the external oblique fascia.  We opened the external oblique fascia along the direction of its fibers to the external ring.  A  large hernia was encountered. It appeared to contain only omentum. I was able to gently reduce it. The spermatic cord was circumferentially dissected bluntly and retracted with a Penrose drain.  The floor of the inguinal canal was inspected  And no direct defect was identified.  We skeletonized the spermatic cord and isolated it with a penrose. It was intimately associated with the hernia sac but I was able to eventually tease it off. The large hernia sac had been opened during dissection. I was able to reduce the hernia sac back into the abdomen.  We used a 3 x 6 inch piece of Ultrapro mesh, which was cut into a keyhole shape.  This was secured with 2-0 Prolene, beginning at the pubic tubercle, running this along the shelving edge inferiorly. Superiorly, the mesh was secured to the internal oblique fascia with interrupted 2-0 Prolene sutures.  The tails of the mesh were sutured together behind the spermatic cord.  The mesh was tucked underneath the external oblique fascia laterally.  The external oblique fascia was reapproximated with 2-0 Vicryl.  3-0 Vicryl was used to close the subcutaneous tissues and 4-0 Monocryl was used to close the skin in subcuticular fashion.  Dermabond was used to seal the incision.  The patient was then extubated and brought to the recovery room in stable condition.  All sponge, instrument, and needle counts were correct prior to closure and at the conclusion of the case.   Estimated Blood Loss: Minimal                 Complications: None; patient tolerated the procedure well.         Disposition: PACU - hemodynamically stable.         Condition: stable  Mary Sella.  Andrey CampanileWilson, MD, FACS General, Bariatric, & Minimally Invasive Surgery [facility] [place]

## 2013-08-03 NOTE — Interval H&P Note (Signed)
History and Physical Interval Note:  08/03/2013 9:29 AM  Javier Hines  has presented today for surgery, with the diagnosis of inguinal hernia   The various methods of treatment have been discussed with the patient and family. After consideration of risks, benefits and other options for treatment, the patient has consented to  Procedure(s): OPEN REPAIR RIGHT INCARCERATED INGUINAL HERNIA (N/A) INSERTION OF MESH (N/A) as a surgical intervention .  The patient's history has been reviewed, patient examined, no change in status, stable for surgery.  I have reviewed the patient's chart and labs.  Questions were answered to the patient's satisfaction.    BP 180/103  Pulse 96  Temp(Src) 97.7 F (36.5 C) (Oral)  Resp 18  SpO2 100%  Despite his blood sugar being uncontrolled and noncompliant with his meds for BP, I think we need to go ahead with surgery b/c he is having more RLQ pain given his incarcerated inguinal hernia  Javier Hines. Andrey Campanile, MD, FACS General, Bariatric, & Minimally Invasive Surgery [facility] [place]  Atilano Ina

## 2013-08-03 NOTE — Anesthesia Preprocedure Evaluation (Addendum)
Anesthesia Evaluation  Patient identified by MRN, date of birth, ID band Patient awake    Reviewed: Allergy & Precautions, H&P , NPO status , Patient's Chart, lab work & pertinent test results  Airway Mallampati: II TM Distance: >3 FB     Dental  (+) Teeth Intact, Loose, Dental Advisory Given   Pulmonary neg pulmonary ROS,  breath sounds clear to auscultation        Cardiovascular hypertension, Pt. on medications Rhythm:Regular Rate:Normal     Neuro/Psych negative neurological ROS  negative psych ROS   GI/Hepatic Neg liver ROS, GERD-  Medicated,  Endo/Other  diabetes, Poorly Controlled, Type 2, Oral Hypoglycemic Agents  Renal/GU negative Renal ROS  negative genitourinary   Musculoskeletal negative musculoskeletal ROS (+)   Abdominal   Peds  Hematology negative hematology ROS (+)   Anesthesia Other Findings   Reproductive/Obstetrics                          Anesthesia Physical Anesthesia Plan  ASA: II  Anesthesia Plan: General   Post-op Pain Management:    Induction: Intravenous  Airway Management Planned: Oral ETT  Additional Equipment:   Intra-op Plan:   Post-operative Plan: Extubation in OR  Informed Consent: I have reviewed the patients History and Physical, chart, labs and discussed the procedure including the risks, benefits and alternatives for the proposed anesthesia with the patient or authorized representative who has indicated his/her understanding and acceptance.   Dental advisory given  Plan Discussed with: CRNA  Anesthesia Plan Comments:         Anesthesia Quick Evaluation

## 2013-08-06 ENCOUNTER — Encounter (HOSPITAL_COMMUNITY): Payer: Self-pay | Admitting: General Surgery

## 2013-08-06 LAB — GLUCOSE, CAPILLARY: GLUCOSE-CAPILLARY: 92 mg/dL (ref 70–99)

## 2013-09-06 ENCOUNTER — Encounter (INDEPENDENT_AMBULATORY_CARE_PROVIDER_SITE_OTHER): Payer: Self-pay | Admitting: General Surgery

## 2014-05-08 ENCOUNTER — Emergency Department (HOSPITAL_COMMUNITY)
Admission: EM | Admit: 2014-05-08 | Discharge: 2014-05-08 | Disposition: A | Discharge status: Home / Self Care | Payer: No Typology Code available for payment source | Attending: Emergency Medicine | Admitting: Emergency Medicine

## 2014-05-08 ENCOUNTER — Encounter (HOSPITAL_COMMUNITY): Payer: Self-pay | Admitting: Emergency Medicine

## 2014-05-08 ENCOUNTER — Emergency Department (HOSPITAL_COMMUNITY): Payer: No Typology Code available for payment source

## 2014-05-08 DIAGNOSIS — I10 Essential (primary) hypertension: Secondary | ICD-10-CM | POA: Insufficient documentation

## 2014-05-08 DIAGNOSIS — S299XXA Unspecified injury of thorax, initial encounter: Secondary | ICD-10-CM | POA: Diagnosis not present

## 2014-05-08 DIAGNOSIS — Y92410 Unspecified street and highway as the place of occurrence of the external cause: Secondary | ICD-10-CM | POA: Diagnosis not present

## 2014-05-08 DIAGNOSIS — Y9389 Activity, other specified: Secondary | ICD-10-CM | POA: Insufficient documentation

## 2014-05-08 DIAGNOSIS — Z87448 Personal history of other diseases of urinary system: Secondary | ICD-10-CM | POA: Diagnosis not present

## 2014-05-08 DIAGNOSIS — E119 Type 2 diabetes mellitus without complications: Secondary | ICD-10-CM | POA: Diagnosis not present

## 2014-05-08 DIAGNOSIS — Y998 Other external cause status: Secondary | ICD-10-CM | POA: Insufficient documentation

## 2014-05-08 DIAGNOSIS — Z79899 Other long term (current) drug therapy: Secondary | ICD-10-CM | POA: Diagnosis not present

## 2014-05-08 DIAGNOSIS — R0789 Other chest pain: Secondary | ICD-10-CM

## 2014-05-08 LAB — I-STAT TROPONIN, ED: Troponin i, poc: 0.01 ng/mL (ref 0.00–0.08)

## 2014-05-08 MED ORDER — HYDROCODONE-ACETAMINOPHEN 5-325 MG PO TABS: 1.0000 | ORAL_TABLET | ORAL | Status: AC | PRN
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
## 2014-05-08 NOTE — ED Notes (Signed)
[employer] at bedside to speak with patient. 
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
## 2014-05-08 NOTE — ED Notes (Signed)
Pt placed on 5-lead and kept on pulse ox. RN informed.
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
## 2014-05-13 ENCOUNTER — Encounter (HOSPITAL_COMMUNITY): Payer: Self-pay | Admitting: Physical Medicine and Rehabilitation

## 2014-05-13 ENCOUNTER — Inpatient Hospital Stay (HOSPITAL_COMMUNITY): Payer: Self-pay

## 2014-05-13 ENCOUNTER — Emergency Department (HOSPITAL_COMMUNITY): Payer: No Typology Code available for payment source

## 2014-05-13 ENCOUNTER — Inpatient Hospital Stay (HOSPITAL_COMMUNITY)
Admission: EM | Admit: 2014-05-13 | Discharge: 2014-05-15 | DRG: 287 | Disposition: A | Discharge status: Home / Self Care | Payer: Self-pay | Attending: Cardiology | Admitting: Cardiology

## 2014-05-13 ENCOUNTER — Encounter (HOSPITAL_COMMUNITY)
Admission: EM | Disposition: A | Discharge status: Home / Self Care | Payer: Self-pay | Source: Home / Self Care | Attending: Cardiology

## 2014-05-13 DIAGNOSIS — R079 Chest pain, unspecified: Secondary | ICD-10-CM | POA: Diagnosis present

## 2014-05-13 DIAGNOSIS — R0789 Other chest pain: Principal | ICD-10-CM | POA: Diagnosis present

## 2014-05-13 DIAGNOSIS — N189 Chronic kidney disease, unspecified: Secondary | ICD-10-CM | POA: Diagnosis present

## 2014-05-13 DIAGNOSIS — I2510 Atherosclerotic heart disease of native coronary artery without angina pectoris: Secondary | ICD-10-CM | POA: Diagnosis present

## 2014-05-13 DIAGNOSIS — Z79899 Other long term (current) drug therapy: Secondary | ICD-10-CM

## 2014-05-13 DIAGNOSIS — E785 Hyperlipidemia, unspecified: Secondary | ICD-10-CM | POA: Diagnosis present

## 2014-05-13 DIAGNOSIS — I129 Hypertensive chronic kidney disease with stage 1 through stage 4 chronic kidney disease, or unspecified chronic kidney disease: Secondary | ICD-10-CM | POA: Diagnosis present

## 2014-05-13 DIAGNOSIS — I472 Ventricular tachycardia: Secondary | ICD-10-CM | POA: Diagnosis not present

## 2014-05-13 DIAGNOSIS — I998 Other disorder of circulatory system: Secondary | ICD-10-CM | POA: Diagnosis present

## 2014-05-13 DIAGNOSIS — R9431 Abnormal electrocardiogram [ECG] [EKG]: Secondary | ICD-10-CM | POA: Diagnosis present

## 2014-05-13 DIAGNOSIS — I10 Essential (primary) hypertension: Secondary | ICD-10-CM

## 2014-05-13 DIAGNOSIS — E119 Type 2 diabetes mellitus without complications: Secondary | ICD-10-CM

## 2014-05-13 DIAGNOSIS — N289 Disorder of kidney and ureter, unspecified: Secondary | ICD-10-CM

## 2014-05-13 DIAGNOSIS — K219 Gastro-esophageal reflux disease without esophagitis: Secondary | ICD-10-CM | POA: Diagnosis present

## 2014-05-13 DIAGNOSIS — I213 ST elevation (STEMI) myocardial infarction of unspecified site: Secondary | ICD-10-CM

## 2014-05-13 DIAGNOSIS — R0989 Other specified symptoms and signs involving the circulatory and respiratory systems: Secondary | ICD-10-CM

## 2014-05-13 DIAGNOSIS — E1165 Type 2 diabetes mellitus with hyperglycemia: Secondary | ICD-10-CM | POA: Diagnosis present

## 2014-05-13 HISTORY — DX: Type 2 diabetes mellitus without complications: E11.9

## 2014-05-13 HISTORY — DX: Gastro-esophageal reflux disease without esophagitis: K21.9

## 2014-05-13 HISTORY — DX: Hyperlipidemia, unspecified: E78.5

## 2014-05-13 HISTORY — PX: LEFT HEART CATHETERIZATION WITH CORONARY ANGIOGRAM: SHX5451

## 2014-05-13 LAB — BASIC METABOLIC PANEL WITH GFR
Anion gap: 8 (ref 5–15)
BUN: 25 mg/dL — ABNORMAL HIGH (ref 6–23)
CO2: 27 mmol/L (ref 19–32)
Calcium: 8.8 mg/dL (ref 8.4–10.5)
Chloride: 100 mmol/L (ref 96–112)
Creatinine, Ser: 1.92 mg/dL — ABNORMAL HIGH (ref 0.50–1.35)
GFR calc Af Amer: 41 mL/min — ABNORMAL LOW
GFR calc non Af Amer: 35 mL/min — ABNORMAL LOW
Glucose, Bld: 161 mg/dL — ABNORMAL HIGH (ref 70–99)
Potassium: 4 mmol/L (ref 3.5–5.1)
Sodium: 135 mmol/L (ref 135–145)

## 2014-05-13 LAB — PROTIME-INR
INR: 0.98 (ref 0.00–1.49)
INR: 1.01 (ref 0.00–1.49)
Prothrombin Time: 13 seconds (ref 11.6–15.2)
Prothrombin Time: 13.4 s (ref 11.6–15.2)

## 2014-05-13 LAB — CBC
HCT: 38 % — ABNORMAL LOW (ref 39.0–52.0)
HEMOGLOBIN: 13.2 g/dL (ref 13.0–17.0)
MCH: 27.4 pg (ref 26.0–34.0)
MCHC: 34.7 g/dL (ref 30.0–36.0)
MCV: 79 fL (ref 78.0–100.0)
PLATELETS: 231 10*3/uL (ref 150–400)
RBC: 4.81 MIL/uL (ref 4.22–5.81)
RDW: 13 % (ref 11.5–15.5)
WBC: 7.1 10*3/uL (ref 4.0–10.5)

## 2014-05-13 LAB — GLUCOSE, CAPILLARY
Glucose-Capillary: 181 mg/dL — ABNORMAL HIGH (ref 70–99)
Glucose-Capillary: 204 mg/dL — ABNORMAL HIGH (ref 70–99)
Glucose-Capillary: 72 mg/dL (ref 70–99)

## 2014-05-13 LAB — COMPREHENSIVE METABOLIC PANEL
ALK PHOS: 61 U/L (ref 39–117)
ALT: 11 U/L (ref 0–53)
AST: 20 U/L (ref 0–37)
Albumin: 3.5 g/dL (ref 3.5–5.2)
Anion gap: 8 (ref 5–15)
BILIRUBIN TOTAL: 1.1 mg/dL (ref 0.3–1.2)
BUN: 26 mg/dL — ABNORMAL HIGH (ref 6–23)
CALCIUM: 9.1 mg/dL (ref 8.4–10.5)
CO2: 24 mmol/L (ref 19–32)
Chloride: 101 mmol/L (ref 96–112)
Creatinine, Ser: 2 mg/dL — ABNORMAL HIGH (ref 0.50–1.35)
GFR calc non Af Amer: 34 mL/min — ABNORMAL LOW (ref >90)
GFR, EST AFRICAN AMERICAN: 39 mL/min — AB (ref >90)
GLUCOSE: 256 mg/dL — AB (ref 70–99)
POTASSIUM: 4.2 mmol/L (ref 3.5–5.1)
SODIUM: 133 mmol/L — AB (ref 135–145)
Total Protein: 7.1 g/dL (ref 6.0–8.3)

## 2014-05-13 LAB — MRSA PCR SCREENING: MRSA by PCR: NEGATIVE

## 2014-05-13 LAB — POCT I-STAT, CHEM 8
BUN: 28 mg/dL — ABNORMAL HIGH (ref 6–23)
CHLORIDE: 101 mmol/L (ref 96–112)
Calcium, Ion: 1.28 mmol/L (ref 1.13–1.30)
Creatinine, Ser: 1.9 mg/dL — ABNORMAL HIGH (ref 0.50–1.35)
Glucose, Bld: 246 mg/dL — ABNORMAL HIGH (ref 70–99)
HEMATOCRIT: 37 % — AB (ref 39.0–52.0)
Hemoglobin: 12.6 g/dL — ABNORMAL LOW (ref 13.0–17.0)
Potassium: 3.9 mmol/L (ref 3.5–5.1)
Sodium: 136 mmol/L (ref 135–145)
TCO2: 21 mmol/L (ref 0–100)

## 2014-05-13 LAB — BRAIN NATRIURETIC PEPTIDE: B Natriuretic Peptide: 120.7 pg/mL — ABNORMAL HIGH (ref 0.0–100.0)

## 2014-05-13 LAB — POCT I-STAT TROPONIN I: TROPONIN I, POC: 0 ng/mL (ref 0.00–0.08)

## 2014-05-13 LAB — TROPONIN I
Troponin I: <0.03 ng/mL (ref <0.031)
Troponin I: <0.03 ng/mL

## 2014-05-13 LAB — TSH: TSH: 1.362 u[IU]/mL (ref 0.350–4.500)

## 2014-05-13 LAB — APTT: aPTT: 32 seconds (ref 24–37)

## 2014-05-13 SURGERY — LEFT HEART CATHETERIZATION WITH CORONARY ANGIOGRAM
Anesthesia: LOCAL

## 2014-05-13 MED ORDER — ASPIRIN EC 81 MG PO TBEC
81.0000 mg | DELAYED_RELEASE_TABLET | Freq: Every day | ORAL | Status: DC
  Administered 2014-05-14 – 2014-05-15 (×2): 81 mg via ORAL
  Filled 2014-05-13 (×2): qty 1

## 2014-05-13 MED ORDER — FENTANYL CITRATE 0.05 MG/ML IJ SOLN
INTRAMUSCULAR | Status: AC
  Filled 2014-05-13: qty 2

## 2014-05-13 MED ORDER — ASPIRIN 300 MG RE SUPP
300.0000 mg | RECTAL | Status: AC
  Filled 2014-05-13: qty 1

## 2014-05-13 MED ORDER — ATORVASTATIN CALCIUM 80 MG PO TABS
80.0000 mg | ORAL_TABLET | Freq: Every day | ORAL | Status: DC
  Administered 2014-05-13: 80 mg via ORAL
  Filled 2014-05-13 (×2): qty 1

## 2014-05-13 MED ORDER — NITROGLYCERIN IN D5W 200-5 MCG/ML-% IV SOLN
0.0000 ug/min | INTRAVENOUS | Status: DC
  Administered 2014-05-13: 10 ug/min via INTRAVENOUS

## 2014-05-13 MED ORDER — ACETAMINOPHEN 325 MG PO TABS
650.0000 mg | ORAL_TABLET | ORAL | Status: DC | PRN
  Administered 2014-05-13: 650 mg via ORAL

## 2014-05-13 MED ORDER — ASPIRIN 81 MG PO CHEW: 324.0000 mg | CHEWABLE_TABLET | ORAL | Status: AC

## 2014-05-13 MED ORDER — ONDANSETRON HCL 4 MG/2ML IJ SOLN: 4.0000 mg | Freq: Four times a day (QID) | INTRAMUSCULAR | Status: DC | PRN

## 2014-05-13 MED ORDER — SODIUM CHLORIDE 0.9 % IJ SOLN: 3.0000 mL | INTRAMUSCULAR | Status: DC | PRN

## 2014-05-13 MED ORDER — NITROGLYCERIN 0.4 MG SL SUBL: 0.4000 mg | SUBLINGUAL_TABLET | SUBLINGUAL | Status: DC | PRN

## 2014-05-13 MED ORDER — NITROGLYCERIN IN D5W 200-5 MCG/ML-% IV SOLN
INTRAVENOUS | Status: AC
  Filled 2014-05-13: qty 250

## 2014-05-13 MED ORDER — HEPARIN SODIUM (PORCINE) 5000 UNIT/ML IJ SOLN
INTRAMUSCULAR | Status: AC
  Filled 2014-05-13: qty 1

## 2014-05-13 MED ORDER — HYDRALAZINE HCL 10 MG PO TABS
10.0000 mg | ORAL_TABLET | Freq: Three times a day (TID) | ORAL | Status: DC
  Filled 2014-05-13 (×2): qty 1

## 2014-05-13 MED ORDER — SODIUM CHLORIDE 0.9 % IJ SOLN
3.0000 mL | Freq: Two times a day (BID) | INTRAMUSCULAR | Status: DC
  Administered 2014-05-13 – 2014-05-14 (×2): 3 mL via INTRAVENOUS
  Administered 2014-05-14: 10 mL via INTRAVENOUS
  Administered 2014-05-15: 3 mL via INTRAVENOUS

## 2014-05-13 MED ORDER — MORPHINE SULFATE 2 MG/ML IJ SOLN
1.0000 mg | INTRAMUSCULAR | Status: DC | PRN
  Administered 2014-05-14: 1 mg via INTRAVENOUS
  Filled 2014-05-13: qty 1

## 2014-05-13 MED ORDER — ACETAMINOPHEN 325 MG PO TABS
650.0000 mg | ORAL_TABLET | ORAL | Status: DC | PRN
  Filled 2014-05-13: qty 2

## 2014-05-13 MED ORDER — ASPIRIN 81 MG PO CHEW: 324.0000 mg | CHEWABLE_TABLET | Freq: Once | ORAL | Status: DC

## 2014-05-13 MED ORDER — INFLUENZA VAC SPLIT QUAD 0.5 ML IM SUSY
0.5000 mL | PREFILLED_SYRINGE | INTRAMUSCULAR | Status: AC
  Administered 2014-05-14: 0.5 mL via INTRAMUSCULAR
  Filled 2014-05-13: qty 0.5

## 2014-05-13 MED ORDER — HEPARIN (PORCINE) IN NACL 2-0.9 UNIT/ML-% IJ SOLN
INTRAMUSCULAR | Status: AC
  Filled 2014-05-13: qty 1500

## 2014-05-13 MED ORDER — ASPIRIN 81 MG PO CHEW
CHEWABLE_TABLET | ORAL | Status: AC
  Filled 2014-05-13: qty 4

## 2014-05-13 MED ORDER — HEPARIN SODIUM (PORCINE) 5000 UNIT/ML IJ SOLN: 60.0000 [IU]/kg | INTRAMUSCULAR | Status: DC

## 2014-05-13 MED ORDER — SODIUM CHLORIDE 0.9 % IV SOLN: 250.0000 mL | INTRAVENOUS | Status: DC | PRN

## 2014-05-13 MED ORDER — OXYCODONE-ACETAMINOPHEN 5-325 MG PO TABS
1.0000 | ORAL_TABLET | ORAL | Status: DC | PRN
  Administered 2014-05-13: 1 via ORAL
  Administered 2014-05-14: 2 via ORAL
  Administered 2014-05-14 (×2): 1 via ORAL
  Filled 2014-05-13 (×3): qty 1
  Filled 2014-05-13: qty 2

## 2014-05-13 MED ORDER — LIDOCAINE HCL (PF) 1 % IJ SOLN
INTRAMUSCULAR | Status: AC
  Filled 2014-05-13: qty 30

## 2014-05-13 MED ORDER — HYDRALAZINE HCL 10 MG PO TABS
10.0000 mg | ORAL_TABLET | Freq: Three times a day (TID) | ORAL | Status: DC
  Administered 2014-05-13 (×3): 10 mg via ORAL
  Filled 2014-05-13 (×6): qty 1

## 2014-05-13 MED ORDER — MIDAZOLAM HCL 2 MG/2ML IJ SOLN
INTRAMUSCULAR | Status: AC
  Filled 2014-05-13: qty 2

## 2014-05-13 MED ORDER — INSULIN ASPART 100 UNIT/ML ~~LOC~~ SOLN
0.0000 [IU] | Freq: Three times a day (TID) | SUBCUTANEOUS | Status: DC
  Administered 2014-05-13: 5 [IU] via SUBCUTANEOUS
  Administered 2014-05-14: 3 [IU] via SUBCUTANEOUS
  Administered 2014-05-14: 5 [IU] via SUBCUTANEOUS
  Administered 2014-05-15: 8 [IU] via SUBCUTANEOUS
  Administered 2014-05-15: 5 [IU] via SUBCUTANEOUS

## 2014-05-13 MED ORDER — SODIUM CHLORIDE 0.9 % IV SOLN
INTRAVENOUS | Status: AC
  Administered 2014-05-13: 13:00:00 via INTRAVENOUS

## 2014-05-13 MED ORDER — NITROGLYCERIN 1 MG/10 ML FOR IR/CATH LAB
INTRA_ARTERIAL | Status: AC
  Filled 2014-05-13: qty 10

## 2014-05-13 MED ORDER — SODIUM CHLORIDE 0.9 % IV SOLN: INTRAVENOUS | Status: DC

## 2014-05-13 MED ORDER — SODIUM CHLORIDE 0.9 % IV SOLN: INTRAVENOUS | Status: DC | PRN
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
## 2014-05-13 NOTE — Progress Notes (Signed)
  Echocardiogram 2D Echocardiogram has been performed.  Delcie RochENNINGTON, Jheremy Boger 05/13/2014, 2:00 PM

## 2014-05-13 NOTE — ED Notes (Addendum)
Pt monitored by pulse ox, bp cuff, and 12-lead. Cath-lab pads placed on patient and attached to Zoll.
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
## 2014-05-13 NOTE — Progress Notes (Signed)
   05/13/14 1100  Clinical Encounter Type  Visited With Health care provider  Visit Type Initial;Code   Chaplain responded to a code stemi page at 10:55 AM. Patient was already a patient in the ED when code stemi was activated. Charge nurse for the ED notified chaplain that the patient had already been taken to the Cath Lab. Page Merrilyn Puman-Call chaplain if patient has any family that needs support/assistance. Douglass Dunshee, Tommi EmeryBlake R, Chaplain  11:22 AM

## 2014-05-13 NOTE — CV Procedure (Addendum)
    Cardiac Catheterization Procedure Note  Name: Javier Hines MRN: [id number] DOB: 09/28/1950  Procedure: Left Heart Cath, Selective Coronary Angiography, LV angiography  Indication: 64 yo BM presents with chest pain. Ecg shows new ST elevation in leads 1 and AVL with reciprocal ST depression in leads 3, AVF and V5-6.    Procedural Details: The right wrist was prepped, draped, and anesthetized with 1% lidocaine. Using the modified Seldinger technique, a 6 French slender sheath was introduced into the right radial artery. 3 mg of verapamil was administered through the sheath, weight-based unfractionated heparin was administered intravenously. Standard Judkins catheters were used for selective coronary angiography and left ventriculography. Catheter positioning was difficult due to tortuosity in the right innominate artery. Catheter exchanges were performed over an exchange length guidewire. There were no immediate procedural complications. A TR band was used for radial hemostasis at the completion of the procedure.  The patient was transferred to the post catheterization recovery area for further monitoring. 115 cc contrast was used.  Procedural Findings: Hemodynamics: AO 159/82 mean 113 mm Hg LV 154/7 mm Hg  Coronary angiography: Coronary dominance: right  Left mainstem: Normal.  Left anterior descending (LAD): Mild diffuse narrowing of the proximal vessel less than 20%. The first diagonal is normal.   Left circumflex (LCx): There is a long segment of disease in the mid LCx of 50%. Otherwise no significant disease.   Right coronary artery (RCA): The RCA was difficult to engage but flush angiography demonstrated that it was widely patent.   Left ventriculography: Left ventricular systolic function is normal, LVEF is estimated at 55-65%, there is no significant mitral regurgitation. The proximal aorta appears normal.  Final Conclusions:   1. Nonobstructive CAD 2. Normal LV  function.  Recommendations: No clear culprit for his chest pain. There is moderate nonobstructive disease in the mid LCx. There is normal LV function. Will check Echo to assess for pericardial disease and focus on BP control. If he requires cardiac cath in the future I would consider a left radial or groin approach.  [name], MDFACC  05/13/2014, 11:57 AM

## 2014-05-13 NOTE — Care Management Note (Signed)
    Page 1 of 1   05/13/2014     3:47:01 PM CARE MANAGEMENT NOTE 05/13/2014  Patient:  Javier Hines,Javier Hines   Account Number:  [id number]  Date Initiated:  05/13/2014  Documentation initiated by:  Junius CreamerWELL,DEBBIE  Subjective/Objective Assessment:   adm w ch pain     Action/Plan:   lives w wife   Anticipated DC Date:     Anticipated DC Plan:  HOME/SELF CARE         Choice offered to / List presented to:             Status of service:   Medicare Important Message given?   (If response is "NO", the following Medicare IM given date fields will be blank) Date Medicare IM given:   Medicare IM given by:   Date Additional Medicare IM given:   Additional Medicare IM given by:    Discharge Disposition:    Per UR Regulation:  Reviewed for med. necessity/level of care/duration of stay  If discussed at Long Length of Stay Meetings, dates discussed:    Comments:
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
## 2014-05-13 NOTE — ED Notes (Signed)
Activated Code Stemi  

## 2014-05-13 NOTE — ED Notes (Signed)
PT to Cath lab

## 2014-05-14 ENCOUNTER — Encounter (HOSPITAL_COMMUNITY): Payer: Self-pay | Admitting: Cardiology

## 2014-05-14 DIAGNOSIS — R0789 Other chest pain: Principal | ICD-10-CM

## 2014-05-14 DIAGNOSIS — R071 Chest pain on breathing: Secondary | ICD-10-CM

## 2014-05-14 DIAGNOSIS — E785 Hyperlipidemia, unspecified: Secondary | ICD-10-CM

## 2014-05-14 LAB — CBC
HEMATOCRIT: 33.5 % — AB (ref 39.0–52.0)
Hemoglobin: 11.4 g/dL — ABNORMAL LOW (ref 13.0–17.0)
MCH: 26.8 pg (ref 26.0–34.0)
MCHC: 34 g/dL (ref 30.0–36.0)
MCV: 78.8 fL (ref 78.0–100.0)
Platelets: 186 10*3/uL (ref 150–400)
RBC: 4.25 MIL/uL (ref 4.22–5.81)
RDW: 12.9 % (ref 11.5–15.5)
WBC: 5 10*3/uL (ref 4.0–10.5)

## 2014-05-14 LAB — LIPID PANEL
CHOL/HDL RATIO: 7.3 ratio
Cholesterol: 183 mg/dL (ref 0–200)
HDL: 25 mg/dL — ABNORMAL LOW (ref >39)
LDL Cholesterol: 122 mg/dL — ABNORMAL HIGH (ref 0–99)
Triglycerides: 179 mg/dL — ABNORMAL HIGH (ref <150)
VLDL: 36 mg/dL (ref 0–40)

## 2014-05-14 LAB — GLUCOSE, CAPILLARY
Glucose-Capillary: 180 mg/dL — ABNORMAL HIGH (ref 70–99)
Glucose-Capillary: 249 mg/dL — ABNORMAL HIGH (ref 70–99)
Glucose-Capillary: 316 mg/dL — ABNORMAL HIGH (ref 70–99)

## 2014-05-14 LAB — COMPREHENSIVE METABOLIC PANEL
ALK PHOS: 49 U/L (ref 39–117)
ALT: 9 U/L (ref 0–53)
AST: 10 U/L (ref 0–37)
Albumin: 2.9 g/dL — ABNORMAL LOW (ref 3.5–5.2)
Anion gap: 17 — ABNORMAL HIGH (ref 5–15)
BUN: 23 mg/dL (ref 6–23)
CO2: 27 mmol/L (ref 19–32)
Calcium: 9.6 mg/dL (ref 8.4–10.5)
Chloride: 97 mmol/L (ref 96–112)
Creatinine, Ser: 1.87 mg/dL — ABNORMAL HIGH (ref 0.50–1.35)
GFR, EST AFRICAN AMERICAN: 42 mL/min — AB (ref >90)
GFR, EST NON AFRICAN AMERICAN: 36 mL/min — AB (ref >90)
GLUCOSE: 115 mg/dL — AB (ref 70–99)
POTASSIUM: 3.6 mmol/L (ref 3.5–5.1)
SODIUM: 141 mmol/L (ref 135–145)
Total Bilirubin: 0.8 mg/dL (ref 0.3–1.2)
Total Protein: 5.8 g/dL — ABNORMAL LOW (ref 6.0–8.3)

## 2014-05-14 LAB — PROTIME-INR
INR: 1.04 (ref 0.00–1.49)
Prothrombin Time: 13.7 seconds (ref 11.6–15.2)

## 2014-05-14 LAB — TROPONIN I: Troponin I: 0.04 ng/mL — ABNORMAL HIGH (ref <0.031)

## 2014-05-14 LAB — HEMOGLOBIN A1C
HEMOGLOBIN A1C: 10.5 % — AB (ref 4.8–5.6)
Mean Plasma Glucose: 255 mg/dL

## 2014-05-14 MED ORDER — ATORVASTATIN CALCIUM 20 MG PO TABS
20.0000 mg | ORAL_TABLET | Freq: Every day | ORAL | Status: DC
  Administered 2014-05-14: 20 mg via ORAL
  Filled 2014-05-14 (×2): qty 1

## 2014-05-14 MED ORDER — SODIUM CHLORIDE 0.9 % IV SOLN: 250.0000 mL | INTRAVENOUS | Status: DC | PRN

## 2014-05-14 MED ORDER — HYDRALAZINE HCL 25 MG PO TABS
25.0000 mg | ORAL_TABLET | Freq: Three times a day (TID) | ORAL | Status: DC
  Administered 2014-05-14 (×3): 25 mg via ORAL
  Filled 2014-05-14 (×6): qty 1

## 2014-05-14 NOTE — Progress Notes (Signed)
Paged M.D regarding pt high BP. M.D informed RN to check BP after given scheduled Hydralazine PO and Percocet at 2132.   RN rechecked BP manually at 2259. BP still elevated; 188/90. 12 Lead EKG done; NSR. Pt still having chest pain at this time. Pt given Morphine IV 1mg . M.D paged. No page returned.
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
## 2014-05-15 ENCOUNTER — Encounter (HOSPITAL_COMMUNITY): Payer: Self-pay | Admitting: Physician Assistant

## 2014-05-15 DIAGNOSIS — E785 Hyperlipidemia, unspecified: Secondary | ICD-10-CM | POA: Diagnosis present

## 2014-05-15 DIAGNOSIS — K219 Gastro-esophageal reflux disease without esophagitis: Secondary | ICD-10-CM | POA: Diagnosis present

## 2014-05-15 DIAGNOSIS — N289 Disorder of kidney and ureter, unspecified: Secondary | ICD-10-CM

## 2014-05-15 DIAGNOSIS — I10 Essential (primary) hypertension: Secondary | ICD-10-CM | POA: Diagnosis present

## 2014-05-15 DIAGNOSIS — E119 Type 2 diabetes mellitus without complications: Secondary | ICD-10-CM

## 2014-05-15 LAB — HEPATIC FUNCTION PANEL
ALK PHOS: 58 U/L (ref 39–117)
ALT: 12 U/L (ref 0–53)
AST: 13 U/L (ref 0–37)
Albumin: 3 g/dL — ABNORMAL LOW (ref 3.5–5.2)
Total Bilirubin: 0.8 mg/dL (ref 0.3–1.2)
Total Protein: 6 g/dL (ref 6.0–8.3)

## 2014-05-15 LAB — BASIC METABOLIC PANEL
ANION GAP: 10 (ref 5–15)
BUN: 23 mg/dL (ref 6–23)
CHLORIDE: 99 mmol/L (ref 96–112)
CO2: 26 mmol/L (ref 19–32)
Calcium: 9.3 mg/dL (ref 8.4–10.5)
Creatinine, Ser: 1.88 mg/dL — ABNORMAL HIGH (ref 0.50–1.35)
GFR calc Af Amer: 42 mL/min — ABNORMAL LOW (ref >90)
GFR calc non Af Amer: 36 mL/min — ABNORMAL LOW (ref >90)
Glucose, Bld: 353 mg/dL — ABNORMAL HIGH (ref 70–99)
POTASSIUM: 4.3 mmol/L (ref 3.5–5.1)
SODIUM: 135 mmol/L (ref 135–145)

## 2014-05-15 LAB — CBC
HCT: 38.2 % — ABNORMAL LOW (ref 39.0–52.0)
Hemoglobin: 13.2 g/dL (ref 13.0–17.0)
MCH: 27.1 pg (ref 26.0–34.0)
MCHC: 34.6 g/dL (ref 30.0–36.0)
MCV: 78.4 fL (ref 78.0–100.0)
PLATELETS: 203 10*3/uL (ref 150–400)
RBC: 4.87 MIL/uL (ref 4.22–5.81)
RDW: 12.7 % (ref 11.5–15.5)
WBC: 6.9 10*3/uL (ref 4.0–10.5)

## 2014-05-15 LAB — GLUCOSE, CAPILLARY
Glucose-Capillary: 219 mg/dL — ABNORMAL HIGH (ref 70–99)
Glucose-Capillary: 248 mg/dL — ABNORMAL HIGH (ref 70–99)

## 2014-05-15 MED ORDER — AMLODIPINE BESYLATE 5 MG PO TABS: 5.0000 mg | ORAL_TABLET | Freq: Every day | ORAL | Status: DC

## 2014-05-15 MED ORDER — CARVEDILOL 12.5 MG PO TABS
12.5000 mg | ORAL_TABLET | Freq: Two times a day (BID) | ORAL | Status: DC
Start: 2014-05-15 — End: 2014-05-15

## 2014-05-15 MED ORDER — ASPIRIN 81 MG PO TBEC: 81.0000 mg | DELAYED_RELEASE_TABLET | Freq: Every day | ORAL | Status: AC

## 2014-05-15 MED ORDER — AMLODIPINE BESYLATE 5 MG PO TABS
5.0000 mg | ORAL_TABLET | Freq: Every day | ORAL | Status: DC
  Administered 2014-05-15: 5 mg via ORAL
  Filled 2014-05-15: qty 1

## 2014-05-15 MED ORDER — LIVING WELL WITH DIABETES BOOK
Freq: Once | Status: DC
  Filled 2014-05-15: qty 1

## 2014-05-15 MED ORDER — CARVEDILOL 6.25 MG PO TABS: 6.2500 mg | ORAL_TABLET | Freq: Two times a day (BID) | ORAL | Status: AC

## 2014-05-15 MED ORDER — HYDRALAZINE HCL 50 MG PO TABS: 50.0000 mg | ORAL_TABLET | Freq: Three times a day (TID) | ORAL | Status: DC

## 2014-05-15 MED ORDER — CARVEDILOL 6.25 MG PO TABS
6.2500 mg | ORAL_TABLET | Freq: Two times a day (BID) | ORAL | Status: DC
  Administered 2014-05-15: 6.25 mg via ORAL
  Filled 2014-05-15 (×4): qty 1

## 2014-05-15 MED ORDER — HYDROCHLOROTHIAZIDE 25 MG PO TABS
25.0000 mg | ORAL_TABLET | Freq: Every day | ORAL | Status: DC
  Filled 2014-05-15: qty 1

## 2014-05-15 MED ORDER — ATORVASTATIN CALCIUM 20 MG PO TABS: 20.0000 mg | ORAL_TABLET | Freq: Every day | ORAL | Status: AC

## 2014-05-15 MED ORDER — METFORMIN HCL 1000 MG PO TABS: 1000.0000 mg | ORAL_TABLET | Freq: Two times a day (BID) | ORAL | Status: AC

## 2014-05-15 MED ORDER — HYDRALAZINE HCL 50 MG PO TABS
50.0000 mg | ORAL_TABLET | Freq: Three times a day (TID) | ORAL | Status: DC
  Administered 2014-05-15: 50 mg via ORAL
  Filled 2014-05-15 (×3): qty 1

## 2014-05-15 MED ORDER — METOPROLOL TARTRATE 1 MG/ML IV SOLN
2.5000 mg | Freq: Once | INTRAVENOUS | Status: AC
  Administered 2014-05-15: 2.5 mg via INTRAVENOUS
  Filled 2014-05-15: qty 5

## 2014-05-15 NOTE — Progress Notes (Signed)
Spoke briefly with patient/family regarding elevated A1C.  Patient does not speak [place] and friend/family member acted as Equities traderinterpreter.  Discussed importance of follow-up with Dr. Mikeal HawthorneGarba regarding elevated A1C.  According to patient, Dr. Mikeal HawthorneGarba speaks his language which is very helpful.  Discussed importance of controlling diabetes.  Also discussed drinking water instead of juices.  He will definitely need further follow-up.  He did not report further questions but states he will discuss with his PCP.  Thanks, Beryl MeagerJenny Bernarr Longsworth, RN, BC-ADM Inpatient Diabetes Coordinator Pager [phone]
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
## 2014-05-15 NOTE — Progress Notes (Signed)
Paged Dr. Mayford Knifeurner due to pt BP still elevated. New order given by Dr. Mayford Knifeurner to admin 2.5 IV Lopressor. RN will follow orders and cont to monitor.

## 2015-06-04 ENCOUNTER — Other Ambulatory Visit: Payer: Self-pay | Admitting: Physician Assistant

## 2015-06-05 ENCOUNTER — Other Ambulatory Visit: Payer: Self-pay | Admitting: *Deleted

## 2015-06-05 MED ORDER — HYDRALAZINE HCL 50 MG PO TABS: 50.0000 mg | ORAL_TABLET | Freq: Three times a day (TID) | ORAL | Status: AC

## 2015-06-05 MED ORDER — AMLODIPINE BESYLATE 5 MG PO TABS: 5.0000 mg | ORAL_TABLET | Freq: Every day | ORAL | Status: AC

## 2015-06-08 ENCOUNTER — Emergency Department (HOSPITAL_COMMUNITY)
Admission: EM | Admit: 2015-06-08 | Discharge: 2015-06-08 | Disposition: A | Discharge status: Home / Self Care | Payer: Self-pay | Attending: Emergency Medicine | Admitting: Emergency Medicine

## 2015-06-08 ENCOUNTER — Emergency Department (HOSPITAL_COMMUNITY): Payer: No Typology Code available for payment source

## 2015-06-08 ENCOUNTER — Encounter (HOSPITAL_COMMUNITY): Payer: Self-pay | Admitting: Emergency Medicine

## 2015-06-08 DIAGNOSIS — E119 Type 2 diabetes mellitus without complications: Secondary | ICD-10-CM | POA: Insufficient documentation

## 2015-06-08 DIAGNOSIS — Z9889 Other specified postprocedural states: Secondary | ICD-10-CM | POA: Insufficient documentation

## 2015-06-08 DIAGNOSIS — M79605 Pain in left leg: Secondary | ICD-10-CM | POA: Insufficient documentation

## 2015-06-08 DIAGNOSIS — R739 Hyperglycemia, unspecified: Secondary | ICD-10-CM

## 2015-06-08 DIAGNOSIS — Z7982 Long term (current) use of aspirin: Secondary | ICD-10-CM | POA: Insufficient documentation

## 2015-06-08 DIAGNOSIS — I10 Essential (primary) hypertension: Secondary | ICD-10-CM | POA: Insufficient documentation

## 2015-06-08 DIAGNOSIS — M79604 Pain in right leg: Secondary | ICD-10-CM | POA: Insufficient documentation

## 2015-06-08 DIAGNOSIS — Z7984 Long term (current) use of oral hypoglycemic drugs: Secondary | ICD-10-CM | POA: Insufficient documentation

## 2015-06-08 DIAGNOSIS — Z8719 Personal history of other diseases of the digestive system: Secondary | ICD-10-CM | POA: Insufficient documentation

## 2015-06-08 DIAGNOSIS — Z79899 Other long term (current) drug therapy: Secondary | ICD-10-CM | POA: Insufficient documentation

## 2015-06-08 DIAGNOSIS — N289 Disorder of kidney and ureter, unspecified: Secondary | ICD-10-CM | POA: Insufficient documentation

## 2015-06-08 DIAGNOSIS — E860 Dehydration: Secondary | ICD-10-CM | POA: Insufficient documentation

## 2015-06-08 LAB — BASIC METABOLIC PANEL
Anion gap: 11 (ref 5–15)
Anion gap: 9 (ref 5–15)
BUN: 47 mg/dL — AB (ref 6–20)
BUN: 51 mg/dL — AB (ref 6–20)
CALCIUM: 9.1 mg/dL (ref 8.9–10.3)
CHLORIDE: 96 mmol/L — AB (ref 101–111)
CO2: 21 mmol/L — ABNORMAL LOW (ref 22–32)
CO2: 21 mmol/L — ABNORMAL LOW (ref 22–32)
CREATININE: 2.15 mg/dL — AB (ref 0.61–1.24)
CREATININE: 2.5 mg/dL — AB (ref 0.61–1.24)
Calcium: 8.7 mg/dL — ABNORMAL LOW (ref 8.9–10.3)
Chloride: 102 mmol/L (ref 101–111)
GFR calc Af Amer: 29 mL/min — ABNORMAL LOW (ref >60)
GFR calc Af Amer: 35 mL/min — ABNORMAL LOW (ref >60)
GFR calc non Af Amer: 25 mL/min — ABNORMAL LOW (ref >60)
GFR, EST NON AFRICAN AMERICAN: 31 mL/min — AB (ref >60)
GLUCOSE: 454 mg/dL — AB (ref 65–99)
Glucose, Bld: 541 mg/dL — ABNORMAL HIGH (ref 65–99)
Potassium: 4.6 mmol/L (ref 3.5–5.1)
Potassium: 4.6 mmol/L (ref 3.5–5.1)
SODIUM: 128 mmol/L — AB (ref 135–145)
SODIUM: 132 mmol/L — AB (ref 135–145)

## 2015-06-08 LAB — URINALYSIS, ROUTINE W REFLEX MICROSCOPIC
Bilirubin Urine: NEGATIVE
Hgb urine dipstick: NEGATIVE
Ketones, ur: NEGATIVE mg/dL
Leukocytes, UA: NEGATIVE
Nitrite: NEGATIVE
PH: 6 (ref 5.0–8.0)
Protein, ur: NEGATIVE mg/dL
SPECIFIC GRAVITY, URINE: 1.018 (ref 1.005–1.030)

## 2015-06-08 LAB — CBC
HCT: 31.1 % — ABNORMAL LOW (ref 39.0–52.0)
Hemoglobin: 10.7 g/dL — ABNORMAL LOW (ref 13.0–17.0)
MCH: 25.5 pg — ABNORMAL LOW (ref 26.0–34.0)
MCHC: 34.4 g/dL (ref 30.0–36.0)
MCV: 74.2 fL — AB (ref 78.0–100.0)
Platelets: 222 10*3/uL (ref 150–400)
RBC: 4.19 MIL/uL — ABNORMAL LOW (ref 4.22–5.81)
RDW: 13.3 % (ref 11.5–15.5)
WBC: 8.3 10*3/uL (ref 4.0–10.5)

## 2015-06-08 LAB — URINE MICROSCOPIC-ADD ON
BACTERIA UA: NONE SEEN
Squamous Epithelial / LPF: NONE SEEN
WBC UA: NONE SEEN WBC/hpf (ref 0–5)

## 2015-06-08 LAB — CBG MONITORING, ED
GLUCOSE-CAPILLARY: 339 mg/dL — AB (ref 65–99)
Glucose-Capillary: 566 mg/dL (ref 65–99)

## 2015-06-08 LAB — TROPONIN I

## 2015-06-08 MED ORDER — INSULIN ASPART 100 UNIT/ML ~~LOC~~ SOLN
5.0000 [IU] | Freq: Once | SUBCUTANEOUS | Status: AC
  Administered 2015-06-08: 5 [IU] via SUBCUTANEOUS
  Filled 2015-06-08: qty 1

## 2015-06-08 MED ORDER — SODIUM CHLORIDE 0.9 % IV BOLUS (SEPSIS)
1000.0000 mL | Freq: Once | INTRAVENOUS | Status: AC
  Administered 2015-06-08: 1000 mL via INTRAVENOUS

## 2015-06-08 MED ORDER — SODIUM CHLORIDE 0.9 % IV BOLUS (SEPSIS)
500.0000 mL | Freq: Once | INTRAVENOUS | Status: AC
  Administered 2015-06-08: 500 mL via INTRAVENOUS

## 2015-06-08 NOTE — ED Notes (Signed)
Pt's family/friend at bedside---- assisted pt with language interpretation.

## 2015-06-08 NOTE — ED Notes (Signed)
Bed: [id number] Expected date:  Expected time:  Means of arrival:  Comments: EMS 23M hyperglycemia, hypertension, non english
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
## 2015-06-08 NOTE — ED Notes (Signed)
Brought in by EMS from home with c/o leg pain.  Pt's family called EMS and reported that pt "has been sick for the past few days, with c/o leg pain."  Pt's family also reported that pt has diabetes and his blood sugar has been running high.  Pt's CBG was 204 by EMS.  Pt is a poor historian d/t language barrier--- he does not speak any other language but "[place]".  Arrived to ED alert and awake but appears weak.

## 2015-06-08 NOTE — Discharge Instructions (Signed)

## 2015-06-08 NOTE — ED Notes (Signed)
Pt ambulated well in the hall without assistance--- "slow" ambulation observed, but gait and balance steady.  Pt c/o "some cramps" to legs while ambulating.

## 2015-06-08 NOTE — ED Notes (Signed)
Pt tolerated po challenge well; able to consumed 100% of the cup of ice water given.  Pt denied nausea or abdominal discomfort.

## 2015-06-08 NOTE — ED Notes (Signed)
Pt was given ice water for po/fluid challenge.

## 2016-02-28 IMAGING — DX DG CHEST 2V
2 series · 2 of 2 positions shown · non-contrast
Comparison: 07/25/2013

CLINICAL DATA: Renal insufficiency

EXAM:
CHEST  2 VIEW

[chest pa]
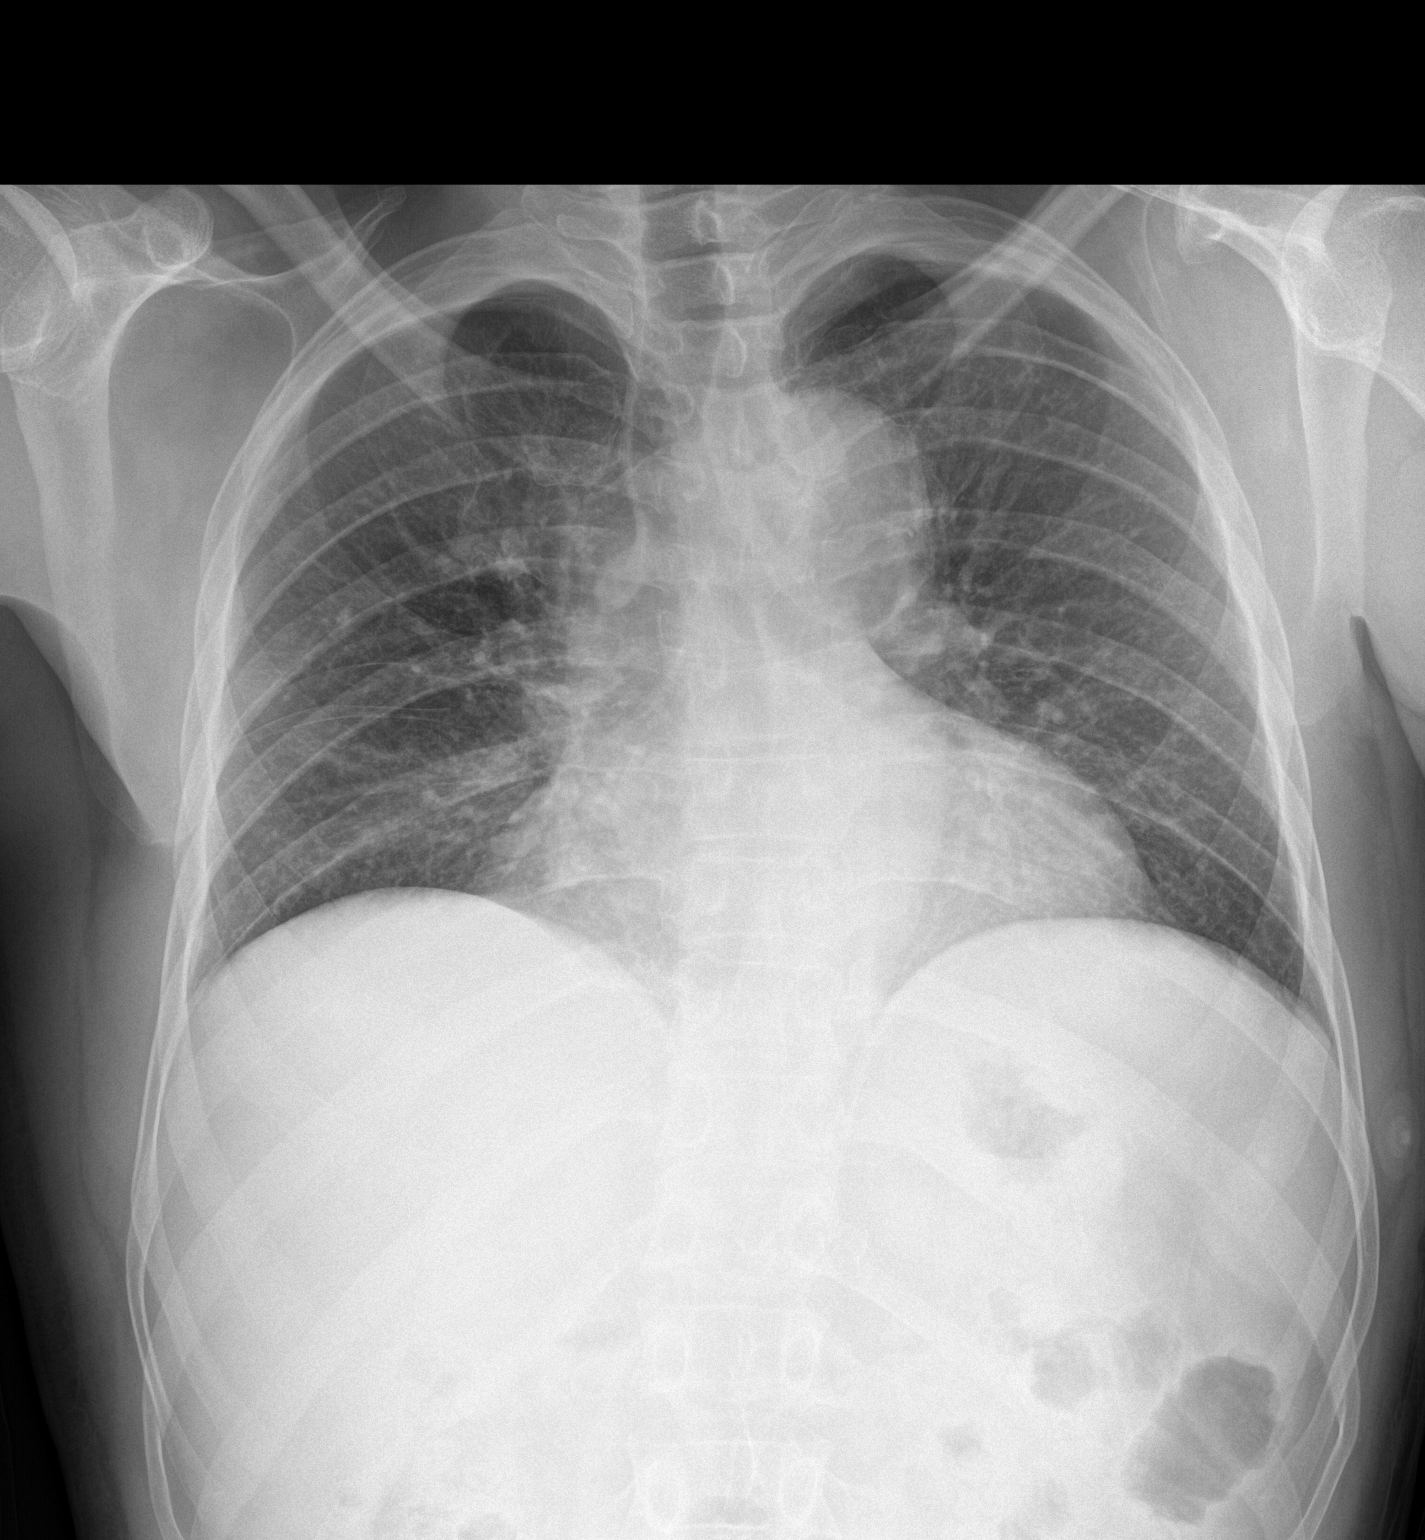

[chest lat]
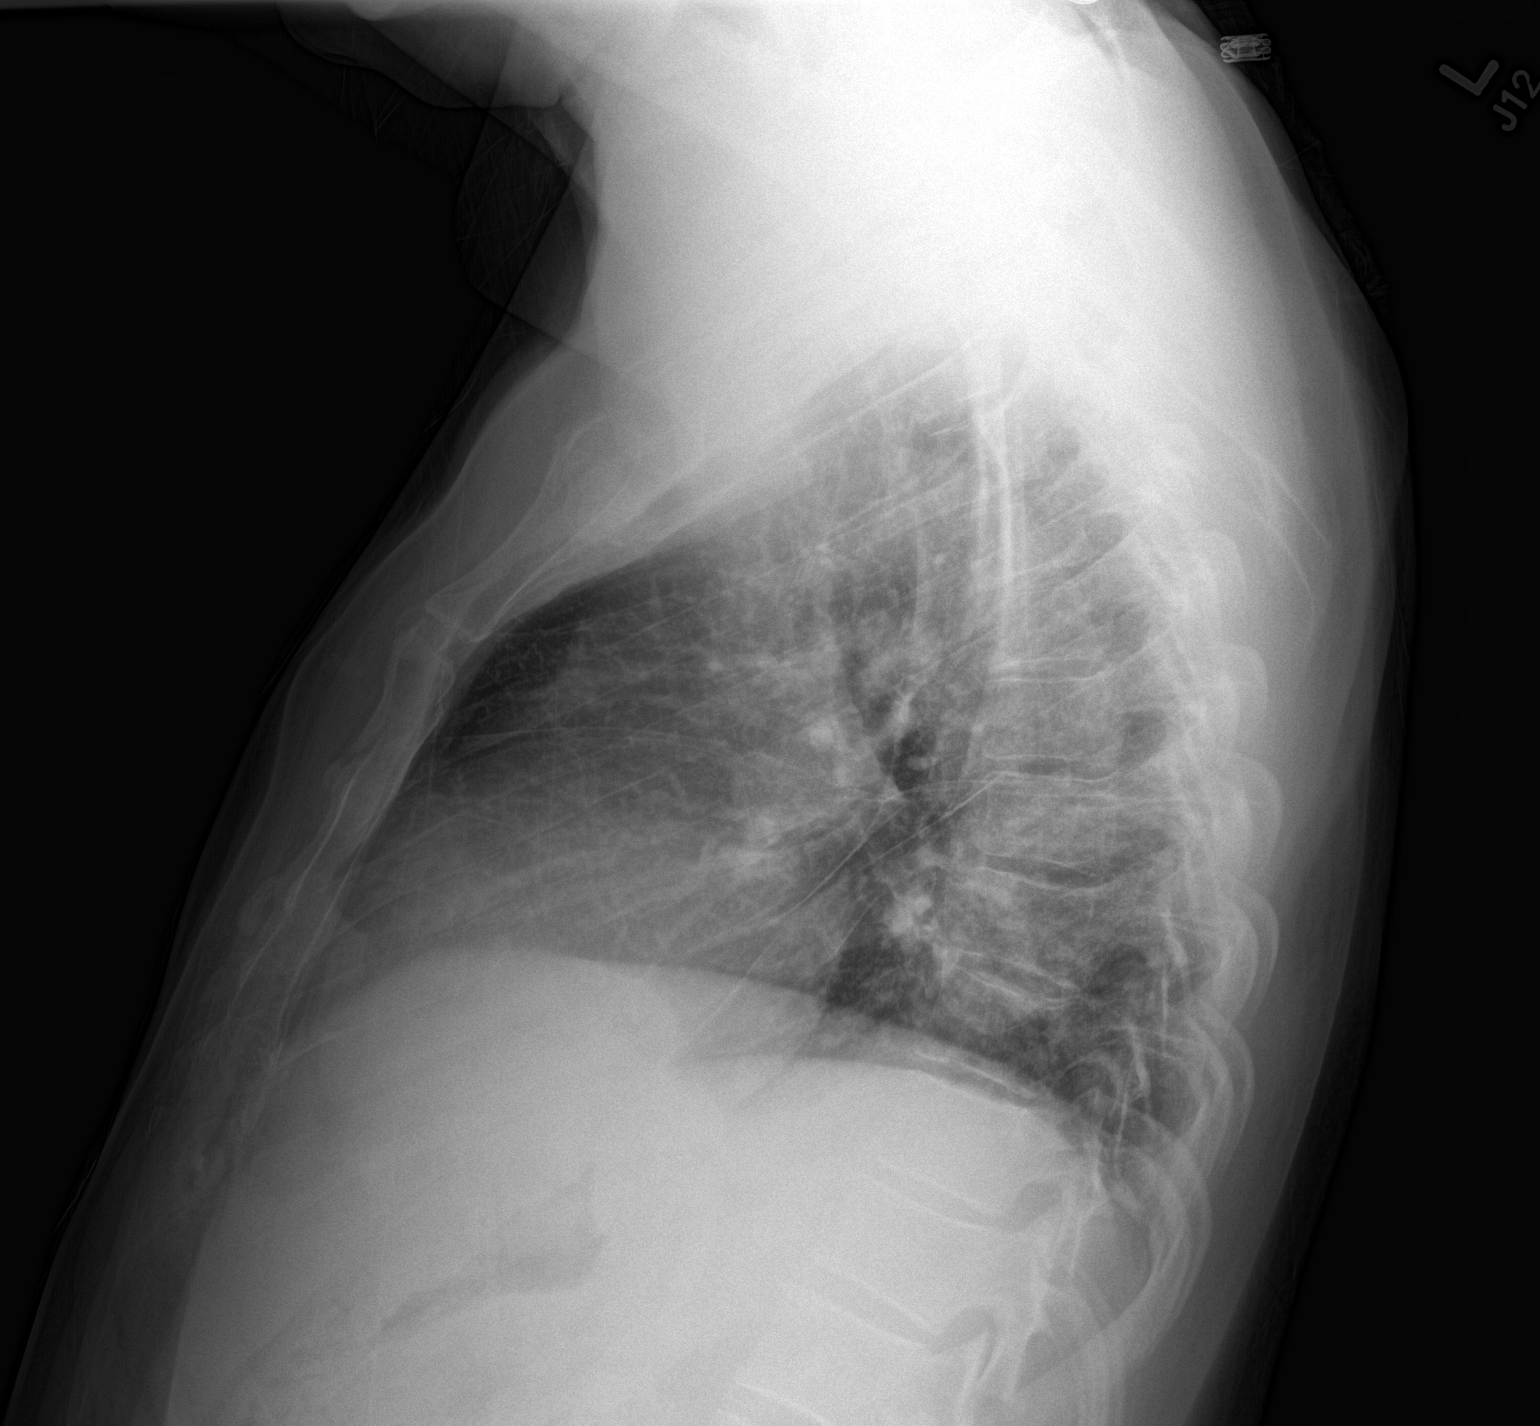

[2 of 2 positions shown; findings below may reference images not displayed]

FINDINGS: Cardiomediastinal silhouette is stable. No acute infiltrate or
pulmonary edema. Mild basilar atelectasis. Bony thorax is stable.
IMPRESSION: No active cardiopulmonary disease.

## 2016-03-04 IMAGING — CR DG CHEST 1V PORT
1 series · 1 of 1 positions shown · non-contrast
Comparison: 05/08/2014

CLINICAL DATA: Pain in center of chest.  Cough.

EXAM:
PORTABLE CHEST - 1 VIEW

[AP]
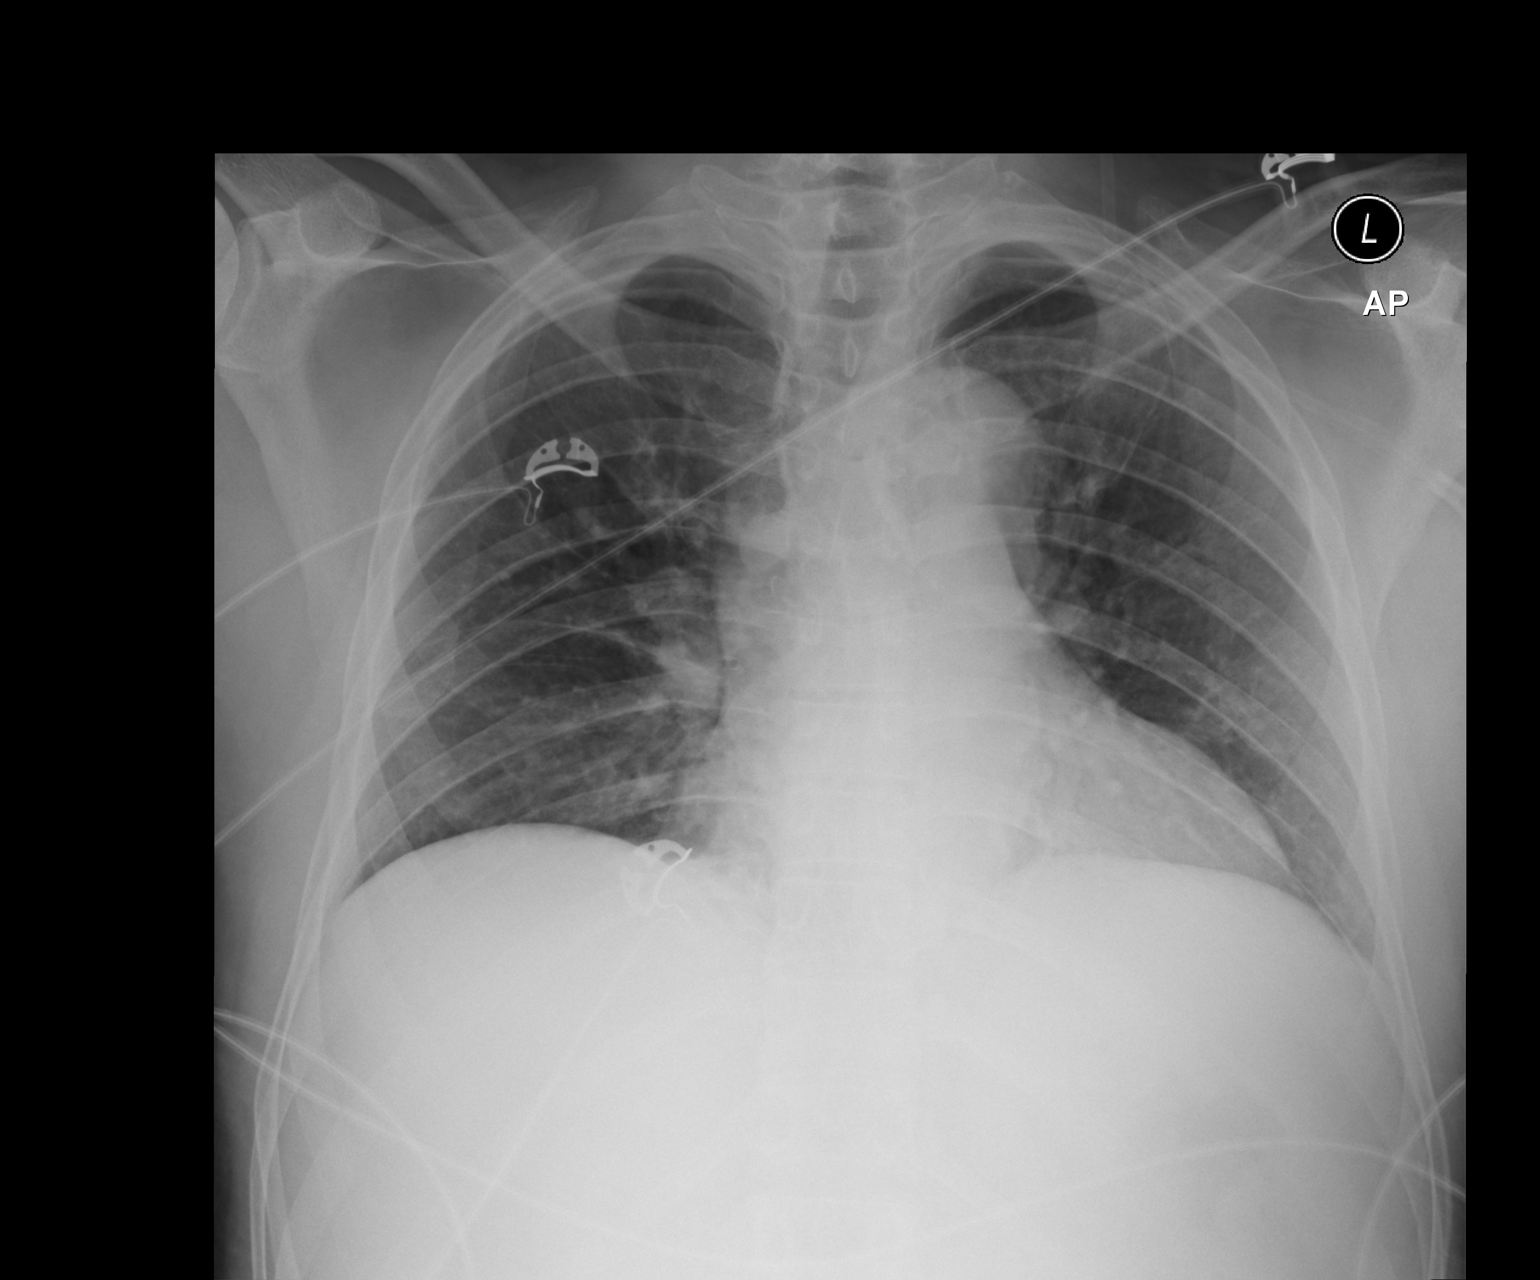

[1 of 1 positions shown; findings below may reference images not displayed]

FINDINGS: Low lung volumes are present, causing crowding of the pulmonary
vasculature. Mildly tortuous thoracic aorta. Accounting for the
portable technique and low lung volumes, heart size is considered
borderline enlarged. No edema. No pleural effusion or discrete
airspace opacity.
IMPRESSION: 1. Low lung volumes are present, causing crowding of the pulmonary
vasculature.
2. Borderline enlargement of the cardiopericardial silhouette.

## 2017-03-30 IMAGING — CR DG CHEST 2V
2 series · 2 of 2 positions shown · non-contrast
Comparison: Chest radiograph performed 05/13/2014

CLINICAL DATA: Acute onset of moderate cramping lower abdominal
pain, extending down the legs. Headache. Initial encounter.

EXAM:
CHEST  2 VIEW

[w chest lat]
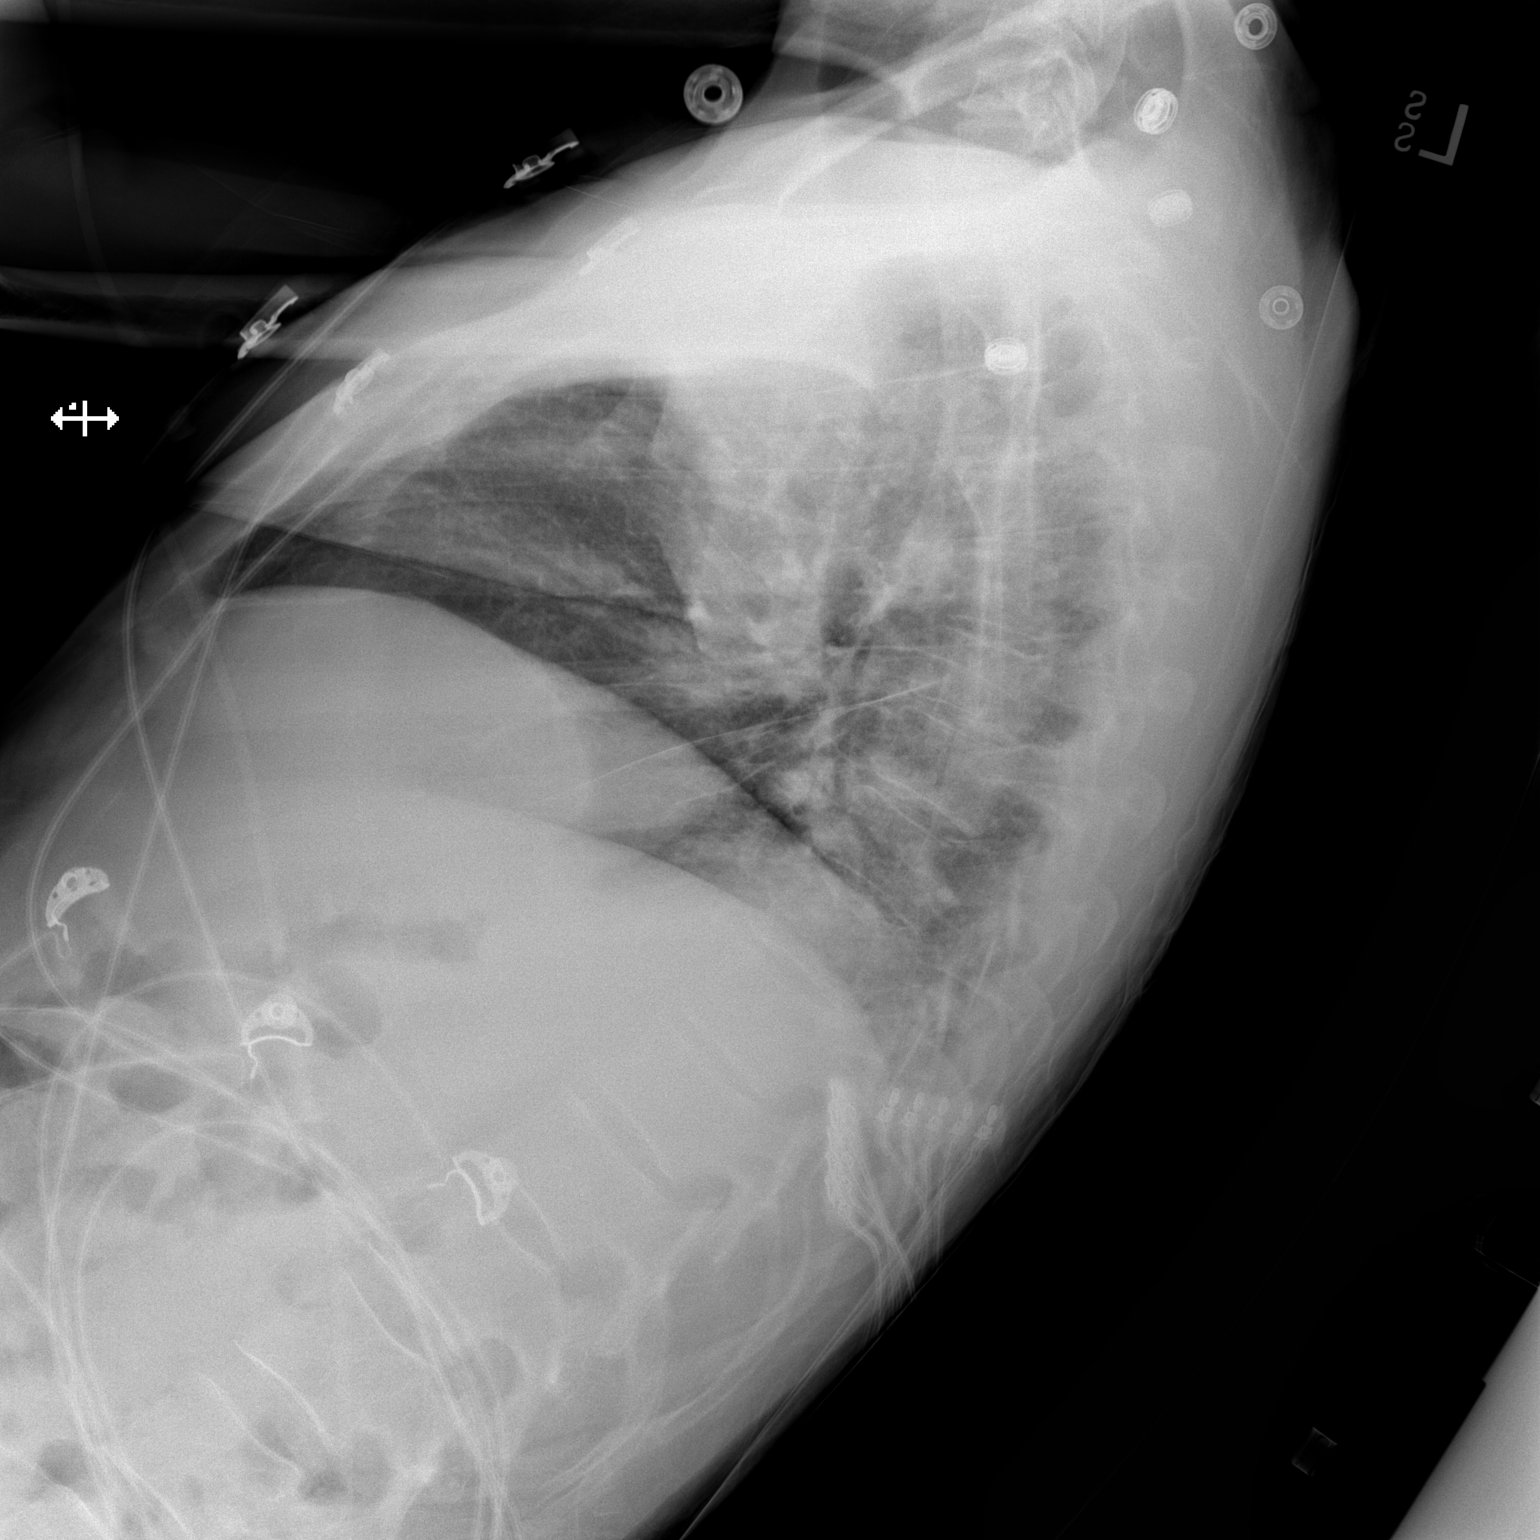

[x chest ap]
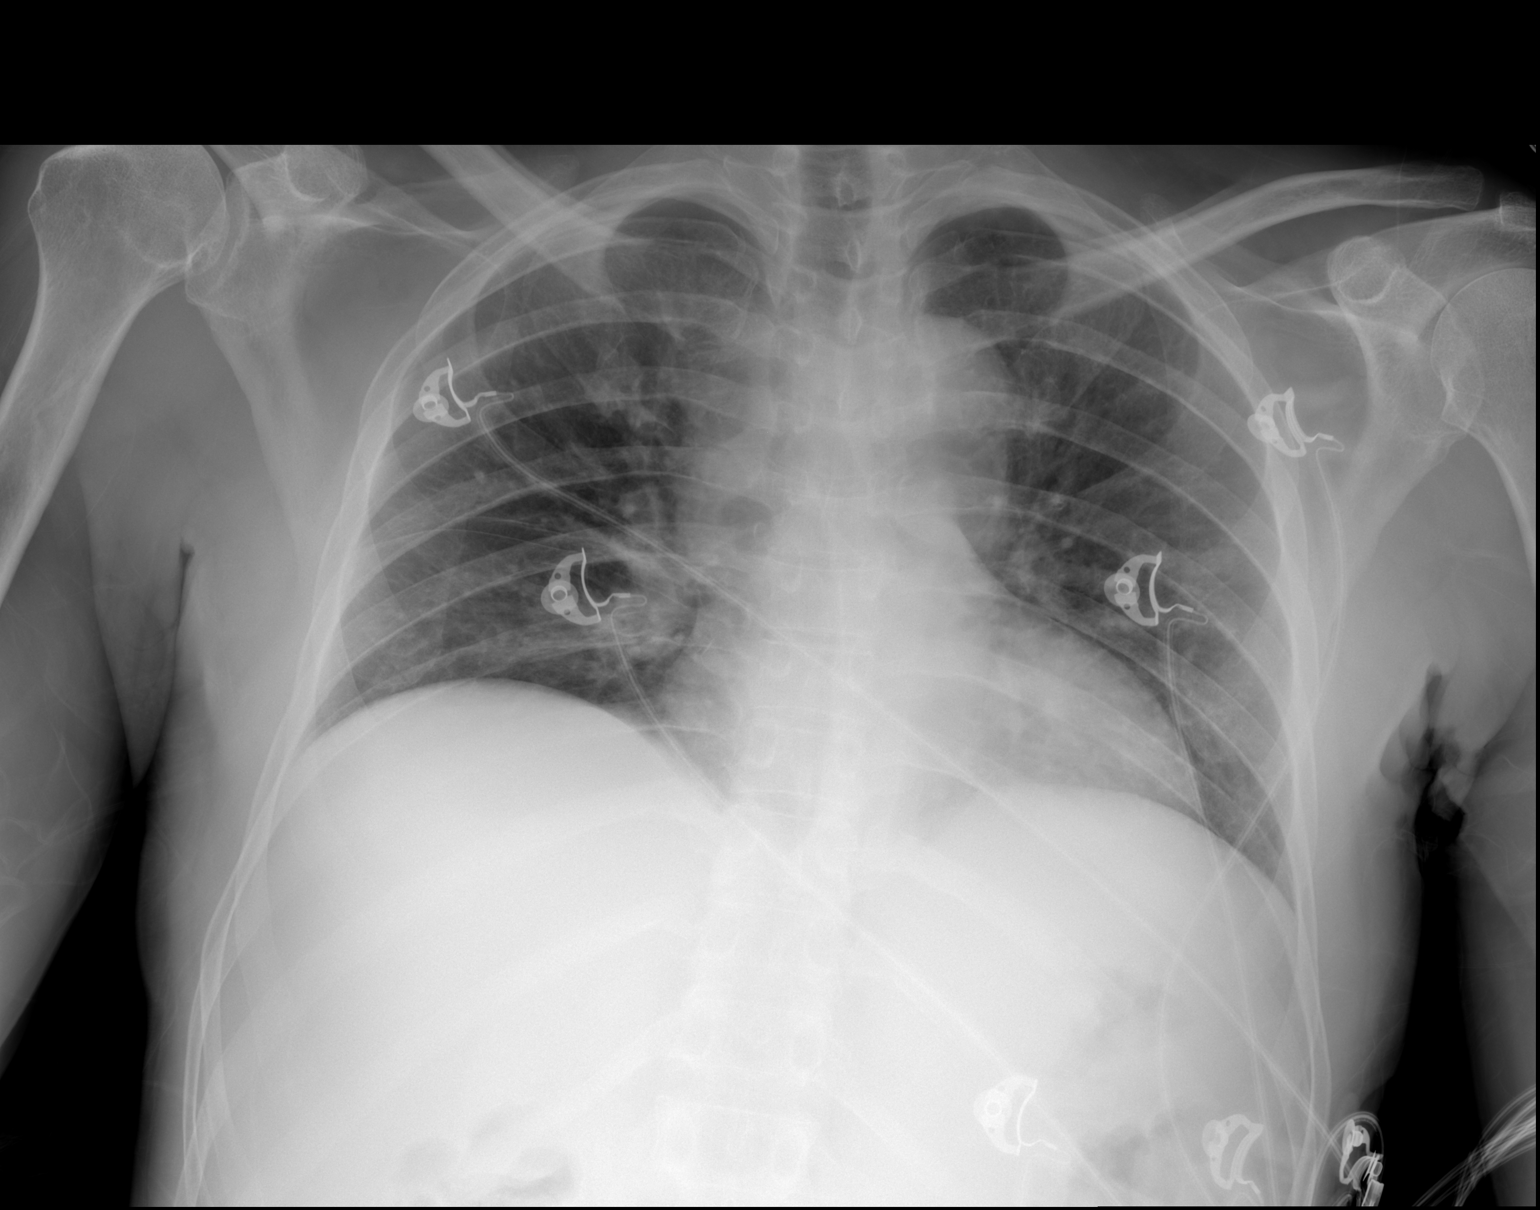

[2 of 2 positions shown; findings below may reference images not displayed]

FINDINGS: The lungs are hypoexpanded. Mild bilateral atelectasis is noted.
There is no evidence of pleural effusion or pneumothorax.

The heart is normal in size; the mediastinal contour is within
normal limits. No acute osseous abnormalities are seen.
IMPRESSION: Lungs hypoexpanded, with mild bilateral atelectasis.
